# Patient Record
Sex: Female | Born: 1965 | Hispanic: No | Marital: Married | Smoking: Current every day smoker
Health system: Southern US, Community
[De-identification: ages and names within clinical notes are randomized; demographics above are authoritative.]

## PROBLEM LIST (undated history)

## (undated) DIAGNOSIS — E785 Hyperlipidemia, unspecified: Secondary | ICD-10-CM

## (undated) DIAGNOSIS — D869 Sarcoidosis, unspecified: Secondary | ICD-10-CM

## (undated) DIAGNOSIS — E669 Obesity, unspecified: Secondary | ICD-10-CM

## (undated) DIAGNOSIS — R591 Generalized enlarged lymph nodes: Secondary | ICD-10-CM

## (undated) DIAGNOSIS — F419 Anxiety disorder, unspecified: Secondary | ICD-10-CM

## (undated) HISTORY — DX: Sarcoidosis, unspecified: D86.9

## (undated) HISTORY — DX: Obesity, unspecified: E66.9

## (undated) HISTORY — DX: Generalized enlarged lymph nodes: R59.1

## (undated) HISTORY — PX: BLADDER SURGERY: SHX569

## (undated) HISTORY — DX: Hyperlipidemia, unspecified: E78.5

## (undated) HISTORY — DX: Anxiety disorder, unspecified: F41.9

---

## 1997-06-16 ENCOUNTER — Inpatient Hospital Stay (HOSPITAL_COMMUNITY): Admission: AD | Admit: 1997-06-16 | Discharge: 1997-06-18 | Payer: Self-pay | Admitting: Obstetrics and Gynecology

## 2000-01-25 ENCOUNTER — Ambulatory Visit (HOSPITAL_COMMUNITY): Admission: RE | Admit: 2000-01-25 | Discharge: 2000-01-25 | Payer: Self-pay | Admitting: Internal Medicine

## 2000-01-25 ENCOUNTER — Encounter: Payer: Self-pay | Admitting: Internal Medicine

## 2002-03-22 ENCOUNTER — Other Ambulatory Visit: Admission: RE | Admit: 2002-03-22 | Discharge: 2002-03-22 | Payer: Self-pay | Admitting: Obstetrics and Gynecology

## 2002-03-31 ENCOUNTER — Encounter: Payer: Self-pay | Admitting: Obstetrics and Gynecology

## 2002-03-31 ENCOUNTER — Ambulatory Visit (HOSPITAL_COMMUNITY): Admission: RE | Admit: 2002-03-31 | Discharge: 2002-03-31 | Payer: Self-pay | Admitting: Obstetrics and Gynecology

## 2002-12-17 ENCOUNTER — Ambulatory Visit (HOSPITAL_COMMUNITY): Admission: RE | Admit: 2002-12-17 | Discharge: 2002-12-17 | Payer: Self-pay | Admitting: Internal Medicine

## 2003-04-04 ENCOUNTER — Ambulatory Visit (HOSPITAL_COMMUNITY): Admission: RE | Admit: 2003-04-04 | Discharge: 2003-04-04 | Payer: Self-pay | Admitting: Obstetrics and Gynecology

## 2003-04-07 ENCOUNTER — Other Ambulatory Visit: Admission: RE | Admit: 2003-04-07 | Discharge: 2003-04-07 | Payer: Self-pay | Admitting: Obstetrics and Gynecology

## 2003-05-06 ENCOUNTER — Encounter: Payer: Self-pay | Admitting: Emergency Medicine

## 2004-04-09 ENCOUNTER — Other Ambulatory Visit: Admission: RE | Admit: 2004-04-09 | Discharge: 2004-04-09 | Payer: Self-pay | Admitting: Obstetrics and Gynecology

## 2004-04-12 ENCOUNTER — Ambulatory Visit (HOSPITAL_COMMUNITY): Admission: RE | Admit: 2004-04-12 | Discharge: 2004-04-12 | Payer: Self-pay | Admitting: Obstetrics and Gynecology

## 2004-06-28 ENCOUNTER — Observation Stay (HOSPITAL_COMMUNITY): Admission: RE | Admit: 2004-06-28 | Discharge: 2004-06-29 | Payer: Self-pay | Admitting: Obstetrics and Gynecology

## 2004-11-21 ENCOUNTER — Ambulatory Visit: Payer: Self-pay | Admitting: Internal Medicine

## 2005-05-09 ENCOUNTER — Ambulatory Visit (HOSPITAL_COMMUNITY): Admission: RE | Admit: 2005-05-09 | Discharge: 2005-05-09 | Payer: Self-pay | Admitting: Obstetrics and Gynecology

## 2005-05-22 ENCOUNTER — Other Ambulatory Visit: Admission: RE | Admit: 2005-05-22 | Discharge: 2005-05-22 | Payer: Self-pay | Admitting: Obstetrics and Gynecology

## 2005-07-04 ENCOUNTER — Ambulatory Visit: Payer: Self-pay | Admitting: Internal Medicine

## 2005-09-17 ENCOUNTER — Encounter: Admission: RE | Admit: 2005-09-17 | Discharge: 2005-09-17 | Payer: Self-pay | Admitting: Obstetrics and Gynecology

## 2006-09-16 ENCOUNTER — Ambulatory Visit: Payer: Self-pay | Admitting: Internal Medicine

## 2006-09-16 LAB — CONVERTED CEMR LAB
ALT: 15 units/L (ref 0–35)
AST: 17 units/L (ref 0–37)
BUN: 15 mg/dL (ref 6–23)
Basophils Absolute: 0 10*3/uL (ref 0.0–0.1)
Basophils Relative: 0.1 % (ref 0.0–1.0)
Calcium: 8.9 mg/dL (ref 8.4–10.5)
Chloride: 106 meq/L (ref 96–112)
Eosinophils Relative: 2.8 % (ref 0.0–5.0)
GFR calc Af Amer: 117 mL/min
HCT: 38.1 % (ref 36.0–46.0)
HDL: 30.7 mg/dL — ABNORMAL LOW (ref >39.0)
Hemoglobin: 13.2 g/dL (ref 12.0–15.0)
LDL Cholesterol: 81 mg/dL (ref 0–99)
Lymphocytes Relative: 35.3 % (ref 12.0–46.0)
Platelets: 173 10*3/uL (ref 150–400)
Potassium: 4.1 meq/L (ref 3.5–5.1)
RBC: 3.99 M/uL (ref 3.87–5.11)
RDW: 12 % (ref 11.5–14.6)
Sodium: 141 meq/L (ref 135–145)
Total Bilirubin: 0.6 mg/dL (ref 0.3–1.2)
Total CHOL/HDL Ratio: 4.2
Total Protein: 6.7 g/dL (ref 6.0–8.3)
WBC: 6.8 10*3/uL (ref 4.5–10.5)

## 2006-09-17 ENCOUNTER — Ambulatory Visit: Payer: Self-pay | Admitting: Internal Medicine

## 2006-09-17 ENCOUNTER — Encounter: Payer: Self-pay | Admitting: Internal Medicine

## 2006-09-17 DIAGNOSIS — F411 Generalized anxiety disorder: Secondary | ICD-10-CM | POA: Insufficient documentation

## 2006-09-17 DIAGNOSIS — D869 Sarcoidosis, unspecified: Secondary | ICD-10-CM | POA: Insufficient documentation

## 2006-09-17 DIAGNOSIS — E785 Hyperlipidemia, unspecified: Secondary | ICD-10-CM | POA: Insufficient documentation

## 2006-09-17 DIAGNOSIS — R599 Enlarged lymph nodes, unspecified: Secondary | ICD-10-CM | POA: Insufficient documentation

## 2006-09-17 DIAGNOSIS — J309 Allergic rhinitis, unspecified: Secondary | ICD-10-CM | POA: Insufficient documentation

## 2006-09-17 DIAGNOSIS — E669 Obesity, unspecified: Secondary | ICD-10-CM | POA: Insufficient documentation

## 2006-09-19 ENCOUNTER — Ambulatory Visit (HOSPITAL_COMMUNITY): Admission: RE | Admit: 2006-09-19 | Discharge: 2006-09-19 | Payer: Self-pay | Admitting: Obstetrics and Gynecology

## 2006-10-10 ENCOUNTER — Inpatient Hospital Stay (HOSPITAL_BASED_OUTPATIENT_CLINIC_OR_DEPARTMENT_OTHER): Admission: RE | Admit: 2006-10-10 | Discharge: 2006-10-10 | Payer: Self-pay | Admitting: Cardiology

## 2006-10-31 ENCOUNTER — Encounter: Payer: Self-pay | Admitting: Emergency Medicine

## 2006-11-25 ENCOUNTER — Encounter: Payer: Self-pay | Admitting: Emergency Medicine

## 2006-12-01 ENCOUNTER — Encounter: Payer: Self-pay | Admitting: Internal Medicine

## 2008-02-10 ENCOUNTER — Ambulatory Visit (HOSPITAL_COMMUNITY): Admission: RE | Admit: 2008-02-10 | Discharge: 2008-02-10 | Payer: Self-pay | Admitting: Obstetrics and Gynecology

## 2009-04-03 ENCOUNTER — Ambulatory Visit (HOSPITAL_COMMUNITY): Admission: RE | Admit: 2009-04-03 | Discharge: 2009-04-03 | Payer: Self-pay | Admitting: Obstetrics and Gynecology

## 2009-09-22 ENCOUNTER — Ambulatory Visit: Payer: Self-pay | Admitting: Emergency Medicine

## 2009-09-22 DIAGNOSIS — F17200 Nicotine dependence, unspecified, uncomplicated: Secondary | ICD-10-CM | POA: Insufficient documentation

## 2009-10-19 ENCOUNTER — Telehealth (INDEPENDENT_AMBULATORY_CARE_PROVIDER_SITE_OTHER): Payer: Self-pay | Admitting: *Deleted

## 2009-10-25 ENCOUNTER — Telehealth (INDEPENDENT_AMBULATORY_CARE_PROVIDER_SITE_OTHER): Payer: Self-pay | Admitting: *Deleted

## 2009-10-30 ENCOUNTER — Ambulatory Visit: Payer: Self-pay | Admitting: Emergency Medicine

## 2009-11-06 ENCOUNTER — Ambulatory Visit: Payer: Self-pay | Admitting: Emergency Medicine

## 2010-02-04 ENCOUNTER — Encounter: Payer: Self-pay | Admitting: Obstetrics and Gynecology

## 2010-02-15 NOTE — Letter (Signed)
Summary: [facility]   Imported By: Sherian Rein 11/30/2009 07:59:40  _____________________________________________________________________  External Attachment:    Type:   Image     Comment:   External Document

## 2010-02-15 NOTE — Letter (Signed)
Summary: Pulmonary/[facility]  Pulmonary/[facility]   Imported By: Sherian Rein 11/08/2009 14:28:37  _____________________________________________________________________  External Attachment:    Type:   Image     Comment:   External Document

## 2010-02-15 NOTE — Progress Notes (Signed)
Summary: talk to nurse  Phone Note Call from Patient Call back at [phone]   Caller: Patient Call For: byrum Summary of Call: need to talk to nurse about her condition Initial call taken by: Rickard Aimee Holmes,  October 19, 2009 2:25 PM  Follow-up for Phone Call        Called and spoke with pt.  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  She is afraid that being exposed to rat feces will cause her sarcoid to flare.  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  She wants to know what RB thinks.  He is out of the office until 10/10 and wants to hear back sooner so will forward to doc of the day.  Pls advise thanks! Follow-up by: Vernie Murders,  October 19, 2009 3:05 PM  Additional Follow-up for Phone Call Additional follow up Details #1::        I don't know any reason why this would affect her sarcoid. Ok for her to keep working there until she can discuss with Dr Delton Coombes. Additional Follow-up by: Waymon Budge MD,  October 19, 2009 4:52 PM    Additional Follow-up for Phone Call Additional follow up Details #2::    Spoke with pt and informed of the above per Dr Maple Hudson.  Pt verbalized understanding.  I advised that I will still forward the msg to RB ion case has any further recs.  If no further recs, just sign the note, thanks! Follow-up by: Vernie Murders,  October 19, 2009 4:57 PM

## 2010-02-15 NOTE — Progress Notes (Signed)
  Phone Note Other Incoming   Request: Send information Summary of Call: Records received from Dr. [facility]. 3 pages forwarded to Dr. Delton Coombes for review.

## 2010-02-15 NOTE — Letter (Signed)
Summary: [facility]   Imported By: Lester Deerfield 10/06/2009 09:16:46  _____________________________________________________________________  External Attachment:    Type:   Image     Comment:   External Document

## 2010-02-15 NOTE — Assessment & Plan Note (Signed)
Summary: Sarcoidosis   Visit Type:  Initial Consult Copy to:  self Primary Provider/Referring Provider:  Dr. Gwenette Greet  CC:  Pt here to have Sarcoidosis followed up. Pt c/o one episode of chest heaviness x 1 month ago.  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 Has been followed by Dr Tennis Must at [facility]. To her knowledge no biopsy done, she has had PFT, CTs and an elevated ACE level. She has had rash, treats with topical hydrocort cream. Has been treated with prednisone x 5 months 2005-6, never since. Breathing has never really been limited. Never needed inhaled medications. Followed by Dr Anne Fu for R atrial enlargement, recent nuclear stress test was normal per her report.   Preventive Screening-Counseling & Management  Alcohol-Tobacco     Alcohol drinks/day: <1     Alcohol type: beer     Smoking Status: current     Smoking Cessation Counseling: yes     Smoke Cessation Stage: contemplative     Packs/Day: 0.5     Year Started: 1995     Pack years: 10  Current Medications (verified): 1)  None  Allergies (verified): No Known Drug Allergies  Past History:  Past Medical History: H/O Lymphadenopathy Obesity Sarcoidosis, no biopsy  Allergic rhinitis Anxiety Hyperlipidemia  Past Surgical History: Bladder tacked  Family History: Reviewed history and no changes required. Family History Breast Cancer-paternal aunts Throat cancer (non-smoker)-paternal grandfather Cancer-paternal grandmother  Social History: Reviewed history and no changes required. Marital Status: Married, lives with spouse Kaylor Maiers Children: Yes Occupation: Homemaker Patient is a current smoker.  Packs/Day:  0.5 Alcohol drinks/day:  <1 Pack years:  10  Review of Systems       The patient complains of chest pain, anxiety, hand/feet swelling, and joint stiffness or pain.  The patient denies  shortness of breath with activity, shortness of breath at rest, productive cough, non-productive cough, coughing up blood, irregular heartbeats, acid heartburn, indigestion, loss of appetite, weight change, abdominal pain, difficulty swallowing, sore throat, tooth/dental problems, headaches, nasal congestion/difficulty breathing through nose, sneezing, itching, ear ache, depression, rash, change in color of mucus, and fever.    Vital Signs:  Patient profile:   45 year old female Height:      63.5 inches Weight:      170.50 pounds BMI:     29.84 O2 Sat:      99 % on Room air Temp:     97.5 degrees F oral Pulse rate:   85 / minute BP sitting:   106 / 62  (right arm) Cuff size:   regular  Vitals Entered By: Zackery Barefoot CMA (September 22, 2009 9:29 AM)  O2 Flow:  Room air CC: Pt here to have Sarcoidosis followed up. Pt c/o one episode of chest heaviness x 1 month ago Comments Medications reviewed with patient Verified contact number and pharmacy with patient Zackery Barefoot CMA  September 22, 2009 9:29 AM    Physical Exam  General:  normal appearance and healthy appearing.   Head:  normocephalic and atraumatic Eyes:  conjunctiva and sclera clear Nose:  no deformity, discharge, inflammation, or lesions Mouth:  no deformity or lesions Neck:  no masses, thyromegaly, or abnormal cervical nodes Lungs:  clear bilaterally to auscultation on normal breath, mild exp wheeze on forced expiration Heart:  regular rate and rhythm, S1, S2 without murmurs, rubs, gallops, or clicks Abdomen:  not examined  Msk:  no deformity or scoliosis noted with normal posture Extremities:  no clubbing, cyanosis, edema, or deformity noted Neurologic:  non-focal Skin:  no rashes Psych:  alert and cooperative; normal mood and affect; normal attention span and concentration   Impression & Recommendations:  Problem # 1:  SARCOIDOSIS, 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 No bx done to  date. Will obtain records from [facility] to review the w/u. No pulm limitation currently and CXR essentially normal.  - CXR today to compare - full PFT's - records from [facility]   Problem # 2:  TOBACCO ABUSE (ICD-305.1) Discussed cesssation, she wants to stop, not ready to set quit date yet  Other Orders: Consultation Level IV (46962) T-2 View CXR (71020TC)  Patient Instructions: 1)  We will obtain your records from [facility] and from [facility].  2)  We will perform full Pulmonary Function Tests at your next visit to compare with your priors. 3)  CXR today.  4)  Follow up with Dr Delton Coombes next available to review your PFTs

## 2010-02-15 NOTE — Miscellaneous (Signed)
Summary: Orders Update pft charges  Clinical Lists Changes  Orders: Added new Service order of Carbon Monoxide diffusing w/capacity (94720) - Signed Added new Service order of Lung Volumes (94240) - Signed Added new Service order of Spirometry (Pre & Post) (94060) - Signed 

## 2010-02-15 NOTE — Assessment & Plan Note (Signed)
Summary: sarcoidosis   Visit Type:  Follow-up Copy to:  self Primary Analiza Cowger/Referring Kiarra Kidd:  Dr. Gwenette Greet  CC:  Sarcoid...discuss PFT results today.  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 Has been followed by Dr Tennis Must at [facility]. To her knowledge no biopsy done, she has had PFT, CTs and an elevated ACE level. She has had rash, treats with topical hydrocort cream. Has been treated with prednisone x 5 months 2005-6, never since. Breathing has never really been limited. Never needed inhaled medications. Followed by Dr Anne Fu for R atrial enlargement, recent nuclear stress test was normal per her report.   ROV 11/06/09 -- returns for f/u after PFTs. Has been followed for presumed sarcoidosis at [facility], now established here. Has been doing well, no breatning limitations. Eye exam up to date. No palpitations or rhythm symptoms.   Current Medications (verified): 1)  None  Allergies (verified): No Known Drug Allergies  Vital Signs:  Patient profile:   45 year old female Height:      64 inches (162.56 cm) Weight:      166 pounds (75.45 kg) BMI:     28.60 O2 Sat:      99 % on Room air Temp:     98.3 degrees F (36.83 degrees C) oral Pulse rate:   61 / minute BP sitting:   104 / 70  (right arm) Cuff size:   regular  Vitals Entered By: Michel Bickers CMA (November 06, 2009 1:56 PM)  O2 Sat at Rest %:  99 O2 Flow:  Room air CC: Sarcoid...discuss PFT results today Comments Medications reviewed with patient Daytime phone verified.Michel Bickers [facility]  November 06, 2009 2:03 PM   Physical Exam  General:  normal appearance and healthy appearing.   Head:  normocephalic and atraumatic Eyes:  conjunctiva and sclera clear Nose:  no deformity, discharge, inflammation, or lesions Mouth:  no deformity or lesions Neck:  no masses, thyromegaly, or abnormal cervical nodes Lungs:  clear bilaterally  to auscultation on normal breath Heart:  regular rate and rhythm, S1, S2 without murmurs, rubs, gallops, or clicks Abdomen:  not examined Msk:  no deformity or scoliosis noted with normal posture Extremities:  no clubbing, cyanosis, edema, or deformity noted Neurologic:  non-focal Skin:  no rashes Psych:  alert and cooperative; normal mood and affect; normal attention span and concentration   Pulmonary Function Test Date: 10/30/2009 Height (in.): 64 Gender: Female  Pre-Spirometry FVC    Value: 9.97 L/min   Pred: 3.40 L/min     % Pred: 99 % FEV1    Value: 2.75 L     Pred: 2.61 L     % Pred: 105 % FEV1/FVC  Value: 81 %     Pred: 76 %    FEF 25-75  Value: 2.83 L/min   Pred: 3.01 L/min     % Pred: 94 %  Post-Spirometry FVC    Value: 3.29 L/min   Pred: 3.40 L/min     % Pred: 97 % FEV1    Value: 2.58 L     Pred: 2.61 L     % Pred: 99 % FEV1/FVC  Value: 78 %     Pred: 76 %    FEF 25-75  Value: 2.48 L/min   Pred: 3.01 L/min     % Pred: 83 %  Lung Volumes TLC    Value: 5.18 L   %  Pred: 103 % RV    Value: 1.64 L   % Pred: 96 % DLCO    Value: 17.3 %   % Pred: 74 % DLCO/VA  Value: 4.23 %   % Pred: 104 %  Comments: Normal airflows, normal response to BD. Normal volumes. Decreased difusion that corrects to normal for Va. 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  -f/u in 1 yr with CXR and ACE level.  - may at some point need to reconsider biopsy. If she gets LAD or rash would probably be prudent to confirm the dx.   Other Orders: Est. Patient Level IV (16109)  Patient Instructions: 1)  Your PFTs and your CXR are normal.  2)  We will follow up with a CXR in 1 year.  3)  Call Dr Delton Coombes to be evaluated if you notice any new breathing symptoms, rash, visual changes or paliptations.  4)  Get your eye exam every year.

## 2010-05-29 NOTE — Cardiovascular Report (Signed)
Aimee Holmes, Aimee Holmes             ACCOUNT NO.:  [id number]   MEDICAL RECORD NO.:  [id number]          PATIENT TYPE:  OIB   LOCATION:  1965                         FACILITY:  [facility]   PHYSICIAN:  Jake Bathe, MD      DATE OF BIRTH:  [birth year]   DATE OF PROCEDURE:  DATE OF DISCHARGE:  10/10/2006                            CARDIAC CATHETERIZATION   PROCEDURE:  Right heart catheterization with thick and thermal cardiac  outputs measured.   INDICATIONS:  Aimee Holmes is a 45 year old African American female with  diagnosis of sarcoidosis with recent echocardiogram demonstrating right  ventricular and right atrial enlargement and mildly elevated pulmonary  artery estimated pressures.   PROCEDURE:  The patient was consented.  Risks and benefits explained.  She was placed on the catheterization table, prepped in a sterile  fashion.  Using 1% lidocaine to infiltrate the groin for local  anesthesia, she was also given 0.5 mg of Versed as well as 12.5 mcg of  fentanyl for conscious sedation.  Using the modified Seldinger  technique, the right femoral vein was cannulated and a 7-French sheath  was placed in the artery under fluoroscopic guidance.  Then using a 7-  French balloon wedge catheter, under fluoroscopic guidance, this was  placed in all right side chambers of the heart and placed in the wedge  position.  Hemodynamics were obtained.  Pulmonary artery saturation was  obtained.  Femoral artery was not obtained, however, estimation for  arterial saturation was 98% on pulse oximetry.  Thermodilution cardiac  output was then run.  The chambers were then assessed hemodynamically  and the catheter was then removed.  The sheath was removed.  The patient  tolerated the procedure well.  Manual compression held, no  complications.   Hemodynamics as follows:  Right atrial pressure 9, right ventricular  pressure 29/4 with an EDP of 10, pulmonary artery pressures 29/12 with a  mean of 18 and  pulmonary capillary wedge pressure of 13 mmHg.   Pulmonary artery saturation was 73%.  Estimated femoral artery  saturation was 98%.  Fick cardiac output was 3.9 l/m with a cardiac  index of 2.2.  Thermodilution cardiac output was 5.3 liters per minute  with a cardiac index of 3.0.   IMPRESSION:  1. Normal right sided pressures.  No evidence of pulmonary      hypertension.  See above for details.  2. Normal cardiac output as measured above.   RECOMMENDATIONS:  We plan MRI at [facility] and will interpret results of 24  hour Holter monitor.  Recommend repeat echocardiogram yearly or if  symptoms develop.  Will also be seeing Dr. Tennis Must of [facility] pulmonary  for consultation.     Jake Bathe, MD  Electronically Signed    MCS/MEDQ  D:  10/10/2006  T:  10/10/2006  Job:  [id number]   cc:   Corwin Levins, MD

## 2010-09-14 ENCOUNTER — Other Ambulatory Visit (HOSPITAL_COMMUNITY): Payer: Self-pay | Admitting: Obstetrics and Gynecology

## 2010-09-14 DIAGNOSIS — Z1231 Encounter for screening mammogram for malignant neoplasm of breast: Secondary | ICD-10-CM

## 2010-09-24 ENCOUNTER — Ambulatory Visit (HOSPITAL_COMMUNITY)
Admission: RE | Admit: 2010-09-24 | Discharge: 2010-09-24 | Disposition: A | Discharge status: Home / Self Care | Payer: Managed Care, Other (non HMO) | Source: Ambulatory Visit | Attending: Obstetrics and Gynecology | Admitting: Obstetrics and Gynecology

## 2010-09-24 DIAGNOSIS — Z1231 Encounter for screening mammogram for malignant neoplasm of breast: Secondary | ICD-10-CM | POA: Insufficient documentation

## 2010-10-25 LAB — POCT I-STAT 3, VENOUS BLOOD GAS (G3P V)
Bicarbonate: 24.4 — ABNORMAL HIGH
TCO2: 26
pO2, Ven: 40

## 2010-11-22 ENCOUNTER — Ambulatory Visit: Payer: Managed Care, Other (non HMO) | Admitting: Emergency Medicine

## 2010-12-14 ENCOUNTER — Encounter: Payer: Self-pay | Admitting: Emergency Medicine

## 2010-12-17 ENCOUNTER — Encounter: Payer: Self-pay | Admitting: Emergency Medicine

## 2010-12-17 ENCOUNTER — Ambulatory Visit (INDEPENDENT_AMBULATORY_CARE_PROVIDER_SITE_OTHER): Payer: Managed Care, Other (non HMO) | Admitting: Emergency Medicine

## 2010-12-17 ENCOUNTER — Ambulatory Visit (INDEPENDENT_AMBULATORY_CARE_PROVIDER_SITE_OTHER)
Admission: RE | Admit: 2010-12-17 | Discharge: 2010-12-17 | Disposition: A | Discharge status: Home / Self Care | Payer: Managed Care, Other (non HMO) | Source: Ambulatory Visit | Attending: Emergency Medicine | Admitting: Emergency Medicine

## 2010-12-17 VITALS — BP 102/70 | HR 76 | Temp 98.6°F | Ht 64.0 in | Wt 169.0 lb

## 2010-12-17 DIAGNOSIS — D869 Sarcoidosis, unspecified: Secondary | ICD-10-CM

## 2010-12-17 NOTE — Assessment & Plan Note (Signed)
Clinically stable, no evidence active dz - cxr today - f/u ~ 1 year or prn - optho exam every yr - cards f/u every yr

## 2010-12-17 NOTE — Patient Instructions (Signed)
CXR today Please call our office if you notice rash, enlarging nodes or breathing difficulty Follow with Dr Delton Coombes in January 2014

## 2010-12-17 NOTE — Progress Notes (Signed)
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 Has been followed by Dr Tennis Must at [facility]. To her knowledge no biopsy done, she has had PFT, CTs and an elevated ACE level. She has had rash, treats with topical hydrocort cream. Has been treated with prednisone x 5 months 2005-6, never since. Breathing has never really been limited. Never needed inhaled medications. Followed by Dr Anne Fu for R atrial enlargement, recent nuclear stress test was normal per her report.   ROV 11/06/09 -- returns for f/u after PFTs. Has been followed for presumed sarcoidosis at [facility], now established here. Has been doing well, no breatning limitations. Eye exam up to date. No palpitations or rhythm symptoms.   ROV 12/17/10 -- 45 yo woman, smoker, hx presumed sarcoidosis. PFT and CXR in 2011 were essentially normal. Not having any current problems. She did have some R wrist pain and treated w steroids. No rash. Saw Dr Anne Fu, got good report regarding rhythm, etc. PCP is Dorothyann Peng.    Pulmonary Function Test  Date: 10/30/2009  Height (in.): 64  Gender: Female  Pre-Spirometry  FVC Value: 9.97 L/min Pred: 3.40 L/min % Pred: 99 %  FEV1 Value: 2.75 L Pred: 2.61 L % Pred: 105 %  FEV1/FVC Value: 81 % Pred: 76 % FEF 25-75 Value: 2.83 L/min Pred: 3.01 L/min % Pred: 94 %  Post-Spirometry  FVC Value: 3.29 L/min Pred: 3.40 L/min % Pred: 97 %  FEV1 Value: 2.58 L Pred: 2.61 L % Pred: 99 %  FEV1/FVC Value: 78 % Pred: 76 % FEF 25-75 Value: 2.48 L/min Pred: 3.01 L/min % Pred: 83 %  Lung Volumes  TLC Value: 5.18 L % Pred: 103 %  RV Value: 1.64 L % Pred: 96 %  DLCO Value: 17.3 % % Pred: 74 %  DLCO/VA Value: 4.23 % % Pred: 104 %  Comments:  Normal airflows, normal response to BD. Normal volumes. Decreased difusion that corrects to normal for Va. RSB  Gen: Pleasant, well-nourished, in no distress,  normal affect  ENT: No lesions,  mouth clear,  oropharynx clear, no  postnasal drip  Neck: No JVD, no TMG, no carotid bruits  Lungs: No use of accessory muscles, no dullness to percussion, clear without rales or rhonchi  Cardiovascular: RRR, heart sounds normal, no murmur or gallops, no peripheral edema  Musculoskeletal: No deformities, no cyanosis or clubbing  Neuro: alert, non focal  Skin: Warm, no lesions or rashes   SARCOIDOSIS, PULMONARY Clinically stable, no evidence active dz - cxr today - f/u ~ 1 year or prn - optho exam every yr - cards f/u every yr

## 2010-12-31 ENCOUNTER — Telehealth: Payer: Self-pay | Admitting: Emergency Medicine

## 2010-12-31 NOTE — Telephone Encounter (Signed)
Please inform pt that his CXR is stable. Thanks--[email or web address]

## 2011-01-01 NOTE — Telephone Encounter (Signed)
Pt aware of cxr results  

## 2011-07-22 ENCOUNTER — Telehealth: Payer: Self-pay | Admitting: Obstetrics and Gynecology

## 2011-07-23 ENCOUNTER — Telehealth: Payer: Self-pay

## 2011-07-23 NOTE — Telephone Encounter (Signed)
LM for pt to cb. Pt is ready to proceed with surgery. Pt will need preop appt and visit w/ AR on the same day, per AR. Melody Comas A

## 2011-07-25 ENCOUNTER — Telehealth: Payer: Self-pay

## 2011-07-25 NOTE — Telephone Encounter (Signed)
Pt called stating that she is ready to sched AP repair and TVT. Per AR's previous chart notes, when pt calls to get this scheduled, she should see EP for pre-op the same day and then Dr. Su Hilt right afterward. Appts booked for 08/07/11 @ 9am w/ EP and 9:45 with AR. Melody Comas A

## 2011-07-31 ENCOUNTER — Telehealth: Payer: Self-pay | Admitting: Obstetrics and Gynecology

## 2011-08-07 ENCOUNTER — Encounter: Payer: Self-pay | Admitting: Obstetrics and Gynecology

## 2011-09-26 ENCOUNTER — Telehealth: Payer: Self-pay

## 2011-09-26 NOTE — Telephone Encounter (Signed)
Faxed over 1 RF on the Hydrocortisone/ Val 0.2% cream  60 gm tube with 0 further RF's  Sig: apply to affected area daily. Per AR. Melody Comas A

## 2012-01-01 ENCOUNTER — Other Ambulatory Visit: Payer: Self-pay | Admitting: Obstetrics and Gynecology

## 2012-01-01 DIAGNOSIS — Z1231 Encounter for screening mammogram for malignant neoplasm of breast: Secondary | ICD-10-CM

## 2012-03-17 ENCOUNTER — Ambulatory Visit (HOSPITAL_COMMUNITY)
Admission: RE | Admit: 2012-03-17 | Discharge: 2012-03-17 | Disposition: A | Discharge status: Home / Self Care | Payer: Managed Care, Other (non HMO) | Source: Ambulatory Visit | Attending: Obstetrics and Gynecology | Admitting: Obstetrics and Gynecology

## 2012-03-17 DIAGNOSIS — Z1231 Encounter for screening mammogram for malignant neoplasm of breast: Secondary | ICD-10-CM

## 2012-03-30 ENCOUNTER — Ambulatory Visit: Payer: Self-pay | Admitting: Obstetrics and Gynecology

## 2012-11-03 ENCOUNTER — Other Ambulatory Visit: Payer: Self-pay | Admitting: Obstetrics and Gynecology

## 2012-11-03 DIAGNOSIS — N611 Abscess of the breast and nipple: Secondary | ICD-10-CM

## 2012-11-03 DIAGNOSIS — N61 Mastitis without abscess: Secondary | ICD-10-CM

## 2012-11-05 ENCOUNTER — Other Ambulatory Visit: Payer: Self-pay | Admitting: Obstetrics and Gynecology

## 2012-11-05 DIAGNOSIS — N611 Abscess of the breast and nipple: Secondary | ICD-10-CM

## 2012-11-16 ENCOUNTER — Other Ambulatory Visit: Payer: Managed Care, Other (non HMO)

## 2012-11-17 ENCOUNTER — Ambulatory Visit
Admission: RE | Admit: 2012-11-17 | Discharge: 2012-11-17 | Disposition: A | Discharge status: Home / Self Care | Payer: Managed Care, Other (non HMO) | Source: Ambulatory Visit | Attending: Obstetrics and Gynecology | Admitting: Obstetrics and Gynecology

## 2012-11-17 DIAGNOSIS — N611 Abscess of the breast and nipple: Secondary | ICD-10-CM

## 2013-04-22 ENCOUNTER — Other Ambulatory Visit: Payer: Self-pay | Admitting: Obstetrics and Gynecology

## 2013-04-22 DIAGNOSIS — Z1231 Encounter for screening mammogram for malignant neoplasm of breast: Secondary | ICD-10-CM

## 2013-05-05 ENCOUNTER — Ambulatory Visit (HOSPITAL_COMMUNITY)
Admission: RE | Admit: 2013-05-05 | Discharge: 2013-05-05 | Disposition: A | Discharge status: Home / Self Care | Payer: BC Managed Care – PPO | Source: Ambulatory Visit | Attending: Obstetrics and Gynecology | Admitting: Obstetrics and Gynecology

## 2013-05-05 DIAGNOSIS — Z1231 Encounter for screening mammogram for malignant neoplasm of breast: Secondary | ICD-10-CM | POA: Insufficient documentation

## 2014-01-18 ENCOUNTER — Encounter: Payer: Self-pay | Admitting: Cardiology

## 2014-03-11 ENCOUNTER — Ambulatory Visit: Payer: Managed Care, Other (non HMO) | Admitting: Cardiology

## 2014-03-18 ENCOUNTER — Ambulatory Visit (INDEPENDENT_AMBULATORY_CARE_PROVIDER_SITE_OTHER): Payer: BLUE CROSS/BLUE SHIELD | Admitting: Cardiology

## 2014-03-18 ENCOUNTER — Encounter: Payer: Self-pay | Admitting: Cardiology

## 2014-03-18 VITALS — BP 116/74 | HR 86 | Ht 64.0 in | Wt 173.0 lb

## 2014-03-18 DIAGNOSIS — Z720 Tobacco use: Secondary | ICD-10-CM

## 2014-03-18 DIAGNOSIS — F17200 Nicotine dependence, unspecified, uncomplicated: Secondary | ICD-10-CM

## 2014-03-18 DIAGNOSIS — D869 Sarcoidosis, unspecified: Secondary | ICD-10-CM

## 2014-03-18 NOTE — Progress Notes (Signed)
      [address] [place], [place]  [id number] Phone: [phone] Fax:  [phone]  Date:  03/18/2014   ID:  Aimee Holmes, DOB 02/14/1965, MRN [id number]  PCP:  Lupita RaiderSHAW,KIMBERLEE, MD   History of Present Illness: Aimee Holmes is a 49 y.o. 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 Holter monitor showed rare PACs, no arrhythmias. She also had a right heart catheterization demonstrating normal right-sided pressures. 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  Overall she is doing very well. No further sarcoidosis flares.   Wt Readings from Last 3 Encounters:  03/18/14 173 lb (78.472 kg)  12/17/10 169 lb (76.658 kg)  11/06/09 166 lb (75.297 kg)     Past Medical History  Diagnosis Date  . Lymphadenopathy   . Obesity   . Sarcoidosis   . Allergic rhinitis   . Anxiety   . Hyperlipidemia     Past Surgical History  Procedure Laterality Date  . Bladder surgery      tack    No current outpatient prescriptions on file.   No current facility-administered medications for this visit.    Allergies:   No Known Allergies  Social History:  The patient  reports that she has been smoking.  She does not have any smokeless tobacco history on file.   Family History  Problem Relation Age of Onset  . Breast cancer Paternal Aunt   . Throat cancer Paternal Grandfather     non smoker  . Cancer Paternal Grandmother     ROS:  Please see the history of present illness.   Denies any fevers, chills, orthopnea, PND, syncope   All other systems reviewed and negative.   PHYSICAL EXAM: VS:  BP 116/74 mmHg  Pulse 86  Ht 5\' 4"  (1.626 m)  Wt 173 lb (78.472 kg)  BMI 29.68 kg/m2 Well nourished, well developed, in no acute distress HEENT: normal, [place]/AT, EOMI Neck: no JVD, normal carotid upstroke, no bruit Cardiac:  normal S1, S2; RRR; no murmur Lungs:   clear to auscultation bilaterally, no wheezing, rhonchi or rales Abd: soft, nontender, no hepatomegaly, no bruits Ext: no edema, 2+ distal pulses Skin: warm and dry GU: deferred Neuro: no focal abnormalities noted, AAO x 3  EKG:  03/18/14-sinus rhythm, nonspecific ST-T wave changes, heart rate 86     ASSESSMENT AND PLAN:  1. Sarcoidosis-I will go ahead and order echocardiogram since it has been a few years. We will evaluate her right-sided heart pressures. Overall she's doing well. 2. Tobacco - encouraged cessation.  3. We will see her back in 2 years.  Signed, Donato SchultzMark Skains, MD [facility]  03/18/2014 2:41 PM

## 2014-03-18 NOTE — Patient Instructions (Signed)
The current medical regimen is effective;  continue present plan and medications.  Your physician has requested that you have an echocardiogram. Echocardiography is a painless test that uses sound waves to create images of your heart. It provides your doctor with information about the size and shape of your heart and how well your heart's chambers and valves are working. This procedure takes approximately one hour. There are no restrictions for this procedure.  Follow up in 2 years with Dr. Anne FuSkains.  You will receive a letter in the mail 2 months before you are due.  Please call us when you receive this letter to schedule your follow up appointment.  Thank you for choosing [facility]!!

## 2014-03-28 ENCOUNTER — Ambulatory Visit (HOSPITAL_COMMUNITY): Payer: BLUE CROSS/BLUE SHIELD | Attending: Cardiology | Admitting: Radiology

## 2014-03-28 DIAGNOSIS — D869 Sarcoidosis, unspecified: Secondary | ICD-10-CM | POA: Diagnosis not present

## 2014-03-28 NOTE — Progress Notes (Signed)
Echocardiogram performed.  

## 2014-03-31 ENCOUNTER — Telehealth: Payer: Self-pay | Admitting: Cardiology

## 2014-03-31 NOTE — Telephone Encounter (Signed)
New Msg        Pt returning call about echo results.  Does pt have an enlarged right atrium?    Please return call.

## 2014-03-31 NOTE — Telephone Encounter (Signed)
Reviewed results of echo with pt.  She is aware that her right atrium is of normal size according to documentation.  She thanked me for my time and explanation.

## 2014-04-25 ENCOUNTER — Other Ambulatory Visit (HOSPITAL_COMMUNITY): Payer: Self-pay | Admitting: Obstetrics and Gynecology

## 2014-04-25 DIAGNOSIS — Z1231 Encounter for screening mammogram for malignant neoplasm of breast: Secondary | ICD-10-CM

## 2014-05-10 ENCOUNTER — Ambulatory Visit (HOSPITAL_COMMUNITY)
Admission: RE | Admit: 2014-05-10 | Discharge: 2014-05-10 | Disposition: A | Discharge status: Home / Self Care | Payer: BLUE CROSS/BLUE SHIELD | Source: Ambulatory Visit | Attending: Obstetrics and Gynecology | Admitting: Obstetrics and Gynecology

## 2014-05-10 DIAGNOSIS — Z1231 Encounter for screening mammogram for malignant neoplasm of breast: Secondary | ICD-10-CM

## 2014-05-16 ENCOUNTER — Other Ambulatory Visit: Payer: Self-pay | Admitting: Obstetrics and Gynecology

## 2014-05-16 DIAGNOSIS — N63 Unspecified lump in unspecified breast: Secondary | ICD-10-CM

## 2015-06-06 ENCOUNTER — Encounter: Payer: Self-pay | Admitting: Cardiology

## 2015-06-09 ENCOUNTER — Ambulatory Visit (INDEPENDENT_AMBULATORY_CARE_PROVIDER_SITE_OTHER): Payer: BLUE CROSS/BLUE SHIELD | Admitting: Cardiology

## 2015-06-09 VITALS — BP 126/76 | HR 84 | Ht 64.0 in | Wt 178.0 lb

## 2015-06-09 DIAGNOSIS — D869 Sarcoidosis, unspecified: Secondary | ICD-10-CM | POA: Diagnosis not present

## 2015-06-09 DIAGNOSIS — F17200 Nicotine dependence, unspecified, uncomplicated: Secondary | ICD-10-CM

## 2015-06-09 DIAGNOSIS — R079 Chest pain, unspecified: Secondary | ICD-10-CM

## 2015-06-09 NOTE — Patient Instructions (Signed)
Medication Instructions:  The current medical regimen is effective;  continue present plan and medications.  Testing/Procedures: Your physician has requested that you have a stress echocardiogram. For further information please visit [email or web address]. Please follow instruction sheet as given.  Follow-Up: Follow up as needed.  If you need a refill on your cardiac medications before your next appointment, please call your pharmacy.  Thank you for choosing [facility]!!

## 2015-06-09 NOTE — Progress Notes (Signed)
      [address] [place], [place]  [address] Phone: [phone] Fax:  [phone]  Date:  06/09/2015   ID:  Aimee AppleVictoria F Graefe, DOB 07/20/1965, MRN [id number]  PCP:  Lupita RaiderSHAW,KIMBERLEE, MD   History of Present Illness: Aimee Holmes is a 50 y.o. 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 Holter monitor showed rare PACs, no arrhythmias. She also had a right heart catheterization demonstrating normal right-sided pressures. She was a [place] from [place] . Echocardiogram 03/28/14 showed normal right-sided pressures.  Sore in left neck, left chest, left leg, week before felt foggy. Nanny 3 days a week. ?anxiety or panic. Happened 2 times past 3 weeks. Felt heart skipping. Likely PVCs.  Overall she is doing very well. No further sarcoidosis flares.   Wt Readings from Last 3 Encounters:  06/09/15 178 lb (80.74 kg)  03/18/14 173 lb (78.472 kg)  12/17/10 169 lb (76.658 kg)     Past Medical History  Diagnosis Date  . Lymphadenopathy   . Obesity   . Sarcoidosis (HCC)   . Allergic rhinitis   . Anxiety   . Hyperlipidemia     Past Surgical History  Procedure Laterality Date  . Bladder surgery      tack    No current outpatient prescriptions on file.   No current facility-administered medications for this visit.    Allergies:   No Known Allergies  Social History:  The patient  reports that she has been smoking.  She does not have any smokeless tobacco history on file.   Family History  Problem Relation Age of Onset  . Breast cancer Paternal Aunt   . Throat cancer Paternal Grandfather     non smoker  . Cancer Paternal Grandmother     ROS:  Please see the history of present illness.   Denies any fevers, chills, orthopnea, PND, syncope   All other systems reviewed and negative.   PHYSICAL EXAM: VS:  BP 126/76 mmHg  Pulse 84  Ht  5\' 4"  (1.626 m)  Wt 178 lb (80.74 kg)  BMI 30.54 kg/m2 Well nourished, well developed, in no acute distress HEENT: normal, [place]/AT, EOMI Neck: no JVD, normal carotid upstroke, no bruit Cardiac:  normal S1, S2; RRR; no murmur Lungs:  clear to auscultation bilaterally, no wheezing, rhonchi or rales Abd: soft, nontender, no hepatomegaly, no bruits Ext: no edema, 2+ distal pulses Skin: warm and dry GU: deferred Neuro: no focal abnormalities noted, AAO x 3  EKG:   EKG was ordered today-06/09/15 normal sinus rhythm 85, nonspecific ST-T wave changes personally viewed-no change from prior-03/18/14-sinus rhythm, nonspecific ST-T wave changes, heart rate 86    Echocardiogram 03/28/14: Normal EF, no evidence of pulmonary hypertension.  ASSESSMENT AND PLAN:  1. Chest pain, atypical, could've been anxiety related however we will go ahead and check a stress echocardiogram to make sure that there is no evidence of ischemia. Currently she is feeling better. 2. Sarcoidosis- Overall she's doing well. No evidence of pulmonary hypertension. 3. Tobacco - encouraged cessation.  4. We will see her back PRN.  Signed, Donato SchultzMark Skains, MD [facility]  06/13/2015 6:31 AM

## 2015-06-13 ENCOUNTER — Encounter: Payer: Self-pay | Admitting: Cardiology

## 2015-06-15 DIAGNOSIS — R87610 Atypical squamous cells of undetermined significance on cytologic smear of cervix (ASC-US): Secondary | ICD-10-CM | POA: Diagnosis not present

## 2015-06-15 DIAGNOSIS — Z1231 Encounter for screening mammogram for malignant neoplasm of breast: Secondary | ICD-10-CM | POA: Diagnosis not present

## 2015-06-15 DIAGNOSIS — Z124 Encounter for screening for malignant neoplasm of cervix: Secondary | ICD-10-CM | POA: Diagnosis not present

## 2015-06-15 DIAGNOSIS — E559 Vitamin D deficiency, unspecified: Secondary | ICD-10-CM | POA: Diagnosis not present

## 2015-06-15 DIAGNOSIS — Z6831 Body mass index (BMI) 31.0-31.9, adult: Secondary | ICD-10-CM | POA: Diagnosis not present

## 2015-06-15 DIAGNOSIS — Z01419 Encounter for gynecological examination (general) (routine) without abnormal findings: Secondary | ICD-10-CM | POA: Diagnosis not present

## 2015-06-15 DIAGNOSIS — Z139 Encounter for screening, unspecified: Secondary | ICD-10-CM | POA: Diagnosis not present

## 2015-06-26 DIAGNOSIS — R1310 Dysphagia, unspecified: Secondary | ICD-10-CM | POA: Diagnosis not present

## 2015-06-27 DIAGNOSIS — E669 Obesity, unspecified: Secondary | ICD-10-CM | POA: Diagnosis not present

## 2015-06-27 DIAGNOSIS — Z720 Tobacco use: Secondary | ICD-10-CM | POA: Diagnosis not present

## 2015-06-27 DIAGNOSIS — Z0000 Encounter for general adult medical examination without abnormal findings: Secondary | ICD-10-CM | POA: Diagnosis not present

## 2015-06-27 DIAGNOSIS — D860 Sarcoidosis of lung: Secondary | ICD-10-CM | POA: Diagnosis not present

## 2015-06-27 DIAGNOSIS — Z791 Long term (current) use of non-steroidal anti-inflammatories (NSAID): Secondary | ICD-10-CM | POA: Diagnosis not present

## 2015-06-29 DIAGNOSIS — N63 Unspecified lump in breast: Secondary | ICD-10-CM | POA: Diagnosis not present

## 2015-06-30 DIAGNOSIS — N62 Hypertrophy of breast: Secondary | ICD-10-CM | POA: Diagnosis not present

## 2015-07-06 DIAGNOSIS — R87619 Unspecified abnormal cytological findings in specimens from cervix uteri: Secondary | ICD-10-CM | POA: Diagnosis not present

## 2015-07-06 DIAGNOSIS — R87610 Atypical squamous cells of undetermined significance on cytologic smear of cervix (ASC-US): Secondary | ICD-10-CM | POA: Diagnosis not present

## 2015-07-15 DIAGNOSIS — H10021 Other mucopurulent conjunctivitis, right eye: Secondary | ICD-10-CM | POA: Diagnosis not present

## 2015-07-17 ENCOUNTER — Other Ambulatory Visit (HOSPITAL_COMMUNITY): Payer: BLUE CROSS/BLUE SHIELD

## 2015-07-31 DIAGNOSIS — R87619 Unspecified abnormal cytological findings in specimens from cervix uteri: Secondary | ICD-10-CM | POA: Diagnosis not present

## 2015-09-28 DIAGNOSIS — J1189 Influenza due to unidentified influenza virus with other manifestations: Secondary | ICD-10-CM | POA: Diagnosis not present

## 2016-03-20 NOTE — Progress Notes (Deleted)
Cardiology Office Note    Date:  03/20/2016   ID:  Aimee Holmes, DOB 07/23/1965, MRN [id number]  PCP:  Lupita RaiderSHAW,KIMBERLEE, MD  Cardiologist:  Dr. Anne FuSkains  CC: fast heart rate  History of Present Illness:  Aimee Holmes is a 51 y.o. female with a history of pulmonary sarcoidosis, HLD, tobacco abuse, and obesity who presents to clinic for evaluation of fast HRs.   She has a history of pulmonary sarcoidosis, prior nuclear stress test 2011 low risk no ischemia, normal EF and echocardiogram in 2011 showing normal right ventricle. She wore a Holter monitor in 2011 that showed rare PACs, no tha arrhythmias. She also had a right heart catheterization demonstrating normal right-sided pressures. [place] was a [place] Echocardiogram 03/28/14 showed normal right-sided pressures.  She was seen by Dr. Anne FuSkains in 05/2015 for follow up. She complained of atypical chest pain. Stress echo was ordered but never completed. It was felt she could follow up PRN.   Today she presents to clinic for evaluation of palpitations.      Past Medical History:  Diagnosis Date  . Allergic rhinitis   . Anxiety   . Hyperlipidemia   . Lymphadenopathy   . Obesity   . Sarcoidosis [facility](HCC)     Past Surgical History:  Procedure Laterality Date  . BLADDER SURGERY     tack    Current Medications: No outpatient prescriptions prior to visit.   No facility-administered medications prior to visit.      Allergies:   Patient has no known allergies.   Social History   Social History  . Marital status: Married    Spouse name: Loyal GamblerBarry Hegarty  . Number of children: Y  . Years of education: N/A   Occupational History  . home maker    Social History Main Topics  . Smoking status: Current Every Day Smoker  . Smokeless tobacco: Not on file  . Alcohol use Not on file  . Drug use: Unknown  . Sexual activity: Not on file   Other Topics Concern  . Not on file   Social History Narrative    . No narrative on file     Family History:  The patient's ***family history includes Breast cancer in her paternal aunt; Cancer in her paternal grandmother; Throat cancer in her paternal grandfather.      *** ROS/PE    Wt Readings from Last 3 Encounters:  06/09/15 178 lb (80.7 kg)  03/18/14 173 lb (78.5 kg)  12/17/10 169 lb (76.7 kg)      Studies/Labs Reviewed:   EKG:  EKG is*** ordered today.  The ekg ordered today demonstrates ***  Recent Labs: No results found for requested labs within last 8760 hours.   Lipid Panel    Component Value Date/Time   CHOL 129 09/16/2006 0855   TRIG 88 09/16/2006 0855   HDL 30.7 (L) 09/16/2006 0855   CHOLHDL 4.2 CALC 09/16/2006 0855   VLDL 18 09/16/2006 0855   LDLCALC 81 09/16/2006 0855    Additional studies/ records that were reviewed today include:  2D ECHO: 03/28/2014 LV EF: 55% -  60% Study Conclusions - Left ventricle: The cavity size was normal. Wall thickness was normal. Systolic function was normal. The estimated ejection fraction was in the range of 55% to 60%. Wall motion was normal; there were no regional wall motion abnormalities. Left ventricular diastolic function parameters were normal. Impressions: - Normal LV function; trace MR and TR; atrial septum appears  thickened on apical 4 chamber view.   ASSESSMENT & PLAN:   Palpitations:  Pulmonary sarcoidosis:  HLD:  Obesity:  Tobacco abuse:    Medication Adjustments/Labs and Tests Ordered: Current medicines are reviewed at length with the patient today.  Concerns regarding medicines are outlined above.  Medication changes, Labs and Tests ordered today are listed in the Patient Instructions below. There are no Patient Instructions on file for this visit.   Signed, Cline Crock, PA-C  03/20/2016 12:33 PM    [facility] [address] [place], [place], [place]  [address] Phone: [phone]; Fax: [phone]

## 2016-03-21 ENCOUNTER — Ambulatory Visit: Payer: BLUE CROSS/BLUE SHIELD | Admitting: Physician Assistant

## 2016-04-02 ENCOUNTER — Encounter: Payer: Self-pay | Admitting: Physician Assistant

## 2016-04-10 NOTE — Progress Notes (Signed)
Cardiology Office Note    Date:  04/11/2016   ID:  Aimee AppleVictoria F Roads, DOB 09/08/1965, MRN [id number]  PCP:  Lupita RaiderSHAW,KIMBERLEE, MD  Cardiologist:  Dr. Anne FuSkains  CC: fast heart rate  History of Present Illness:  Aimee Holmes is a 51 y.o. female with a history of pulmonary sarcoidosis, HLD, tobacco abuse, and obesity who presents to clinic for evaluation of fast HRs.   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 She had [facility] To her knowledge no biopsy done, she has had PFT, CTs and an elevated ACE level. Followed by Dr Delton CoombesByrum last seen in 2012. She has been in remission for this for quite sometime but felt like in early feb 2018 she may have had a flare but it went away quickly with super food vitamins.   She has a history of pulmonary sarcoidosis. In 2008 she had an echo that demonstrated right ventricular and right artrial enlargement and mildly elevated filling pressures, but subsequent right heart cath showed normal right sided pressures and no evidence of pulm HTN. Nuclear stress test 2011 low risk no ischemia, normal EF and echocardiogram in 2011 showing normal right ventricle. She wore a Holter monitor in 2011 that showed rare PACs, no tachy arrhythmias. Echocardiogram 03/28/14 showed normal right-sided pressures.  She was seen by Dr. Anne FuSkains in 05/2015 for follow up. She complained of atypical chest pain. Stress echo was ordered but never completed. It was felt she could follow up PRN.   Today she presents to clinic for evaluation of palpitations. She has not had anymore chest pain. She has had some palpitations like her heart is racing in the middle of the night. She thinks its related to laying on her left side. She is worried it is in her mind. It has lasted for hours and occurs every couple weeks. No CP or SOB. No LE edema, orthopnea or PND. No dizziness or syncope. No blood in stool or  urine. Still smoking 6 cigs a day. She is not on any medications, does not drink alcohol or taking other illegal drugs. She has gained some weight recently and is trying to loose it. She hasn't been exercising this winter and wants to get back into walking. She was a Engineer, siteschool teacher but now nannies. Doesn't feel like she has a lot of stress.    Past Medical History:  Diagnosis Date  . Allergic rhinitis   . Anxiety   . Hyperlipidemia   . Lymphadenopathy   . Obesity   . Sarcoidosis [facility](HCC)     Past Surgical History:  Procedure Laterality Date  . BLADDER SURGERY     tack    Current Medications: No outpatient prescriptions prior to visit.   No facility-administered medications prior to visit.      Allergies:   Patient has no known allergies.   Social History   Social History  . Marital status: Married    Spouse name: Loyal GamblerBarry Pickert  . Number of children: Y  . Years of education: N/A   Occupational History  . home maker    Social History Main Topics  . Smoking status: Current Every Day Smoker  . Smokeless tobacco: Never Used  . Alcohol use No  . Drug use: No  . Sexual activity: Not Asked   Other Topics Concern  . None   Social History Narrative  . None     Family History:  The patient's family history includes Breast cancer in her paternal aunt; Cancer in her paternal grandmother; Throat cancer in her paternal grandfather.      ROS:   Please see the history of present illness.    ROS All other systems reviewed and are negative.   PHYSICAL EXAM:   VS:  BP 134/70   Pulse 94   Ht 5\' 4"  (1.626 m)   Wt 181 lb 4 oz (82.2 kg)   SpO2 95%   BMI 31.11 kg/m    GEN: Well nourished, well developed, in no acute distress  HEENT: normal  Neck: no JVD, carotid bruits, or masses Cardiac: RRR; no murmurs, rubs, or gallops,no edema  Respiratory:  clear to auscultation bilaterally, normal work of breathing GI: soft, nontender, nondistended, + BS MS: no deformity or atrophy   Skin: warm and dry, no rash Neuro:  Alert and Oriented x 3, Strength and sensation are intact Psych: euthymic mood, full affect    Wt Readings from Last 3 Encounters:  04/11/16 181 lb 4 oz (82.2 kg)  06/09/15 178 lb (80.7 kg)  03/18/14 173 lb (78.5 kg)      Studies/Labs Reviewed:   EKG:  EKG is NOT ordered today.    Recent Labs: No results found for requested labs within last 8760 hours.   Lipid Panel    Component Value Date/Time   CHOL 129 09/16/2006 0855   TRIG 88 09/16/2006 0855   HDL 30.7 (L) 09/16/2006 0855   CHOLHDL 4.2 CALC 09/16/2006 0855   VLDL 18 09/16/2006 0855   LDLCALC 81 09/16/2006 0855    Additional studies/ records that were reviewed today include:  2D ECHO: 03/28/2014 LV EF: 55% -  60% Study Conclusions - Left ventricle: The cavity size was normal. Wall thickness was normal. Systolic function was normal. The estimated ejection fraction was in the range of 55% to 60%. Wall motion was normal; there were no regional wall motion abnormalities. Left ventricular diastolic function parameters were normal. Impressions: - Normal LV function; trace MR and TR; atrial septum appears thickened on apical 4 chamber view.   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 Clelia Croft will be checking lipids soon.   Obesity: Body mass index is 31.11 kg/m.   Tobacco abuse: still smoking 6 cigs a day. 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 Discussed importance of quitting    Medication Adjustments/Labs and Tests Ordered: Current medicines are reviewed at length with the patient today.  Concerns regarding medicines are outlined above.  Medication changes, Labs and Tests ordered today are listed in the Patient Instructions below. Patient Instructions  Medication Instructions:  Your physician  recommends that you continue on your current medications as directed. Please refer to the Current Medication list given to you today.   Labwork: None ordered  Testing/Procedures: Your physician has requested that you have an echocardiogram. Echocardiography is a painless test that uses sound waves to create images of your heart. It provides your doctor with information about the size and shape of your heart and how well your heart's chambers and valves are working. This procedure takes approximately one hour. There are no restrictions for this procedure.   Your physician has recommended that you wear an event monitor. Event monitors are medical devices that record the heart's electrical activity. Doctors most often  [place] these monitors to diagnose arrhythmias. Arrhythmias are problems with the speed or rhythm of the heartbeat. The monitor is a small, portable device. You can wear one while you do your normal daily activities. This is usually used to diagnose what is causing palpitations/syncope (passing out).  Follow-Up: Your physician recommends that you schedule a follow-up appointment in: DEPENDING UPON TEST RESULTS    Any Other Special Instructions Will Be Listed Below (If Applicable).   Cardiac Event Monitoring A cardiac event monitor is a small recording device that is used to detect abnormal heart rhythms (arrhythmias). The monitor is used to record your heart rhythm when you have symptoms, such as:  Fast heartbeats (palpitations), such as heart racing or fluttering.  Dizziness.  Fainting or light-headedness.  Unexplained weakness. Some monitors are wired to electrodes placed on your chest. Electrodes are flat, sticky disks that attach to your skin. Other monitors may be hand-held or worn on the wrist. The monitor can be worn for up to 30 days. If the monitor is attached to your chest, a technician will prepare your chest for the electrode placement and show you how to work the  monitor. Take time to practice using the monitor before you leave the office. Make sure you understand how to send the information from the monitor to your health care provider. In some cases, you may need to use a landline telephone instead of a cell phone. What are the risks? Generally, this device is safe to use, but it possible that the skin under the electrodes will become irritated. How to use your cardiac event monitor  Wear your monitor at all times, except when you are in water:  Do not let the monitor get wet.  Take the monitor off when you bathe. Do not swim or use a hot tub with it on.  Keep your skin clean. Do not put body lotion or moisturizer on your chest.  Change the electrodes as told by your health care provider or any time they stop sticking to your skin. You may need to use medical tape to keep them on.  Try to put the electrodes in slightly different places on your chest to help prevent skin irritation. They must remain in the area under your left breast and in the upper right section of your chest.  Make sure the monitor is safely clipped to your clothing or in a location close to your body that your health care provider recommends.  Press the button to record as soon as you feel heart-related symptoms, such as:  Dizziness.  Weakness.  Light-headedness.  Palpitations.  Thumping or pounding in your chest.  Shortness of breath.  Unexplained weakness.  Keep a diary of your activities, such as walking, doing chores, and taking medicine. It is very important to note what you were doing when you pushed the button to record your symptoms. This will help your health care provider determine what might be contributing to your symptoms.  Send the recorded information as recommended by your health care provider. It may take some time for your health care provider to process the results.  Change the batteries as told by your health care provider.  Keep electronic  devices away from your monitor. This includes:  Tablets.  MP3 players.  Cell phones.  While wearing your monitor you should avoid:  Electric blankets.  Firefighter.  Electric toothbrushes.  Microwave ovens.  Magnets.  Metal detectors. Get help right away if:  You have chest pain.  You have extreme difficulty breathing or shortness of breath.  You develop a very fast heartbeat that persists.  You develop dizziness that does not go away.  You faint or constantly feel like you are about to faint. Summary  A cardiac event monitor is a small recording device that is used to help detect abnormal heart rhythms (arrhythmias).  The monitor is used to record your heart rhythm when you have heart-related symptoms.  Make sure you understand how to send the information from the monitor to your health care provider.  It is important to press the button on the monitor when you have any heart-related symptoms.  Keep a diary of your activities, such as walking, doing chores, and taking medicine. It is very important to note what you were doing when you pushed the button to record your symptoms. This will help your health care provider learn what might be causing your symptoms. This information is not intended to replace advice given to you by your health care provider. Make sure you discuss any questions you have with your health care provider. Document Released: 10/10/2007 Document Revised: 12/16/2015 Document Reviewed: 12/16/2015 Elsevier Interactive Patient Education  2017 Elsevier Inc.    Echocardiogram An echocardiogram, or echocardiography, uses sound waves (ultrasound) to produce an image of your heart. The echocardiogram is simple, painless, obtained within a short period of time, and offers valuable information to your health care provider. The images from an echocardiogram can provide information such as:  Evidence of coronary artery disease (CAD).  Heart  size.  Heart muscle function.  Heart valve function.  Aneurysm detection.  Evidence of a past heart attack.  Fluid buildup around the heart.  Heart muscle thickening.  Assess heart valve function. Tell a health care provider about:  Any allergies you have.  All medicines you are taking, including vitamins, herbs, eye drops, creams, and over-the-counter medicines.  Any problems you or family members have had with anesthetic medicines.  Any blood disorders you have.  Any surgeries you have had.  Any medical conditions you have.  Whether you are pregnant or may be pregnant. What happens before the procedure? No special preparation is needed. Eat and drink normally. What happens during the procedure?  In order to produce an image of your heart, gel will be applied to your chest and a wand-like tool (transducer) will be moved over your chest. The gel will help transmit the sound waves from the transducer. The sound waves will harmlessly bounce off your heart to allow the heart images to be captured in real-time motion. These images will then be recorded.  You may need an IV to receive a medicine that improves the quality of the pictures. What happens after the procedure? You may return to your normal schedule including diet, activities, and medicines, unless your health care provider tells you otherwise. This information is not intended to replace advice given to you by your health care provider. Make sure you discuss any questions you have with your health care provider. Document Released: 12/29/1999 Document Revised: 08/19/2015 Document Reviewed: 09/07/2012 Elsevier Interactive Patient Education  2017 [employer].  If you need a refill on your cardiac medications before your next appointment, please call your pharmacy.      Signed, Cline Crock, PA-C  04/11/2016 4:01 PM    [facility] [address] [place], [place], [place]  [address] Phone: [phone]; Fax: [phone]

## 2016-04-11 ENCOUNTER — Encounter: Payer: Self-pay | Admitting: Physician Assistant

## 2016-04-11 ENCOUNTER — Encounter (INDEPENDENT_AMBULATORY_CARE_PROVIDER_SITE_OTHER): Payer: Self-pay

## 2016-04-11 ENCOUNTER — Ambulatory Visit (INDEPENDENT_AMBULATORY_CARE_PROVIDER_SITE_OTHER): Payer: BLUE CROSS/BLUE SHIELD | Admitting: Physician Assistant

## 2016-04-11 VITALS — BP 134/70 | HR 94 | Ht 64.0 in | Wt 181.2 lb

## 2016-04-11 DIAGNOSIS — E785 Hyperlipidemia, unspecified: Secondary | ICD-10-CM

## 2016-04-11 DIAGNOSIS — R002 Palpitations: Secondary | ICD-10-CM

## 2016-04-11 DIAGNOSIS — D869 Sarcoidosis, unspecified: Secondary | ICD-10-CM | POA: Diagnosis not present

## 2016-04-11 DIAGNOSIS — F17200 Nicotine dependence, unspecified, uncomplicated: Secondary | ICD-10-CM

## 2016-04-11 NOTE — Patient Instructions (Signed)
Medication Instructions:  Your physician recommends that you continue on your current medications as directed. Please refer to the Current Medication list given to you today.   Labwork: None ordered  Testing/Procedures: Your physician has requested that you have an echocardiogram. Echocardiography is a painless test that uses sound waves to create images of your heart. It provides your doctor with information about the size and shape of your heart and how well your heart's chambers and valves are working. This procedure takes approximately one hour. There are no restrictions for this procedure.   Your physician has recommended that you wear an event monitor. Event monitors are medical devices that record the heart's electrical activity. Doctors most often [place] these monitors to diagnose arrhythmias. Arrhythmias are problems with the speed or rhythm of the heartbeat. The monitor is a small, portable device. You can wear one while you do your normal daily activities. This is usually used to diagnose what is causing palpitations/syncope (passing out).  Follow-Up: Your physician recommends that you schedule a follow-up appointment in: DEPENDING UPON TEST RESULTS    Any Other Special Instructions Will Be Listed Below (If Applicable).   Cardiac Event Monitoring A cardiac event monitor is a small recording device that is used to detect abnormal heart rhythms (arrhythmias). The monitor is used to record your heart rhythm when you have symptoms, such as:  Fast heartbeats (palpitations), such as heart racing or fluttering.  Dizziness.  Fainting or light-headedness.  Unexplained weakness. Some monitors are wired to electrodes placed on your chest. Electrodes are flat, sticky disks that attach to your skin. Other monitors may be hand-held or worn on the wrist. The monitor can be worn for up to 30 days. If the monitor is attached to your chest, a technician will prepare your chest for the electrode  placement and show you how to work the monitor. Take time to practice using the monitor before you leave the office. Make sure you understand how to send the information from the monitor to your health care provider. In some cases, you may need to use a landline telephone instead of a cell phone. What are the risks? Generally, this device is safe to use, but it possible that the skin under the electrodes will become irritated. How to use your cardiac event monitor  Wear your monitor at all times, except when you are in water:  Do not let the monitor get wet.  Take the monitor off when you bathe. Do not swim or use a hot tub with it on.  Keep your skin clean. Do not put body lotion or moisturizer on your chest.  Change the electrodes as told by your health care provider or any time they stop sticking to your skin. You may need to use medical tape to keep them on.  Try to put the electrodes in slightly different places on your chest to help prevent skin irritation. They must remain in the area under your left breast and in the upper right section of your chest.  Make sure the monitor is safely clipped to your clothing or in a location close to your body that your health care provider recommends.  Press the button to record as soon as you feel heart-related symptoms, such as:  Dizziness.  Weakness.  Light-headedness.  Palpitations.  Thumping or pounding in your chest.  Shortness of breath.  Unexplained weakness.  Keep a diary of your activities, such as walking, doing chores, and taking medicine. It is very important to  note what you were doing when you pushed the button to record your symptoms. This will help your health care provider determine what might be contributing to your symptoms.  Send the recorded information as recommended by your health care provider. It may take some time for your health care provider to process the results.  Change the batteries as told by your  health care provider.  Keep electronic devices away from your monitor. This includes:  Tablets.  MP3 players.  Cell phones.  While wearing your monitor you should avoid:  Electric blankets.  Firefighter.  Electric toothbrushes.  Microwave ovens.  Magnets.  Metal detectors. Get help right away if:  You have chest pain.  You have extreme difficulty breathing or shortness of breath.  You develop a very fast heartbeat that persists.  You develop dizziness that does not go away.  You faint or constantly feel like you are about to faint. Summary  A cardiac event monitor is a small recording device that is used to help detect abnormal heart rhythms (arrhythmias).  The monitor is used to record your heart rhythm when you have heart-related symptoms.  Make sure you understand how to send the information from the monitor to your health care provider.  It is important to press the button on the monitor when you have any heart-related symptoms.  Keep a diary of your activities, such as walking, doing chores, and taking medicine. It is very important to note what you were doing when you pushed the button to record your symptoms. This will help your health care provider learn what might be causing your symptoms. This information is not intended to replace advice given to you by your health care provider. Make sure you discuss any questions you have with your health care provider. Document Released: 10/10/2007 Document Revised: 12/16/2015 Document Reviewed: 12/16/2015 Elsevier Interactive Patient Education  2017 Elsevier Inc.    Echocardiogram An echocardiogram, or echocardiography, uses sound waves (ultrasound) to produce an image of your heart. The echocardiogram is simple, painless, obtained within a short period of time, and offers valuable information to your health care provider. The images from an echocardiogram can provide information such as:  Evidence of coronary  artery disease (CAD).  Heart size.  Heart muscle function.  Heart valve function.  Aneurysm detection.  Evidence of a past heart attack.  Fluid buildup around the heart.  Heart muscle thickening.  Assess heart valve function. Tell a health care provider about:  Any allergies you have.  All medicines you are taking, including vitamins, herbs, eye drops, creams, and over-the-counter medicines.  Any problems you or family members have had with anesthetic medicines.  Any blood disorders you have.  Any surgeries you have had.  Any medical conditions you have.  Whether you are pregnant or may be pregnant. What happens before the procedure? No special preparation is needed. Eat and drink normally. What happens during the procedure?  In order to produce an image of your heart, gel will be applied to your chest and a wand-like tool (transducer) will be moved over your chest. The gel will help transmit the sound waves from the transducer. The sound waves will harmlessly bounce off your heart to allow the heart images to be captured in real-time motion. These images will then be recorded.  You may need an IV to receive a medicine that improves the quality of the pictures. What happens after the procedure? You may return to your normal schedule including diet, activities,  and medicines, unless your health care provider tells you otherwise. This information is not intended to replace advice given to you by your health care provider. Make sure you discuss any questions you have with your health care provider. Document Released: 12/29/1999 Document Revised: 08/19/2015 Document Reviewed: 09/07/2012 Elsevier Interactive Patient Education  2017 [employer]  If you need a refill on your cardiac medications before your next appointment, please call your pharmacy.

## 2016-04-26 ENCOUNTER — Other Ambulatory Visit (HOSPITAL_COMMUNITY): Payer: BLUE CROSS/BLUE SHIELD

## 2016-06-21 DIAGNOSIS — Z6832 Body mass index (BMI) 32.0-32.9, adult: Secondary | ICD-10-CM | POA: Diagnosis not present

## 2016-06-21 DIAGNOSIS — Z124 Encounter for screening for malignant neoplasm of cervix: Secondary | ICD-10-CM | POA: Diagnosis not present

## 2016-06-21 DIAGNOSIS — Z01419 Encounter for gynecological examination (general) (routine) without abnormal findings: Secondary | ICD-10-CM | POA: Diagnosis not present

## 2016-06-21 DIAGNOSIS — Z1231 Encounter for screening mammogram for malignant neoplasm of breast: Secondary | ICD-10-CM | POA: Diagnosis not present

## 2016-08-05 ENCOUNTER — Other Ambulatory Visit (HOSPITAL_COMMUNITY): Payer: Self-pay | Admitting: Respiratory Therapy

## 2016-08-05 ENCOUNTER — Telehealth: Payer: Self-pay | Admitting: Physician Assistant

## 2016-08-05 DIAGNOSIS — D869 Sarcoidosis, unspecified: Secondary | ICD-10-CM

## 2016-08-05 DIAGNOSIS — Z23 Encounter for immunization: Secondary | ICD-10-CM | POA: Diagnosis not present

## 2016-08-05 DIAGNOSIS — Z13220 Encounter for screening for lipoid disorders: Secondary | ICD-10-CM | POA: Diagnosis not present

## 2016-08-05 DIAGNOSIS — Z0000 Encounter for general adult medical examination without abnormal findings: Secondary | ICD-10-CM | POA: Diagnosis not present

## 2016-08-05 NOTE — Telephone Encounter (Signed)
New message  Pt was at [facility] physicians and she wants to have the past cancled echos rescheduled  Pt needs a new order for ECHO

## 2016-08-05 NOTE — Telephone Encounter (Signed)
  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 Clelia CroftShaw will be checking lipids soon.   Obesity: Body mass index is 31.11 kg/m.   Tobacco abuse: still smoking 6 cigs a day. 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 Discussed importance of quitting  New order for echo is in the system and a [facility] to call the pt and reschedule this appt.  This order was placed at the pts last OV with Bary CastillaKaty Thompson PA-C back in March 2018.  Above is Katy's last OV note with the pt. [facility] her back with this appt.  Pt verbalized understanding and agrees with this plan.

## 2016-08-06 ENCOUNTER — Telehealth: Payer: Self-pay | Admitting: Physician Assistant

## 2016-08-06 NOTE — Telephone Encounter (Signed)
Called and LVM for the patient to call back to schedule her echo.

## 2016-08-12 ENCOUNTER — Ambulatory Visit (HOSPITAL_COMMUNITY)
Admission: RE | Admit: 2016-08-12 | Discharge: 2016-08-12 | Disposition: A | Discharge status: Home / Self Care | Payer: BLUE CROSS/BLUE SHIELD | Source: Ambulatory Visit | Attending: Advanced Practice Midwife | Admitting: Advanced Practice Midwife

## 2016-08-12 DIAGNOSIS — D869 Sarcoidosis, unspecified: Secondary | ICD-10-CM | POA: Insufficient documentation

## 2016-08-12 MED ORDER — ALBUTEROL SULFATE (2.5 MG/3ML) 0.083% IN NEBU
2.5000 mg | INHALATION_SOLUTION | Freq: Once | RESPIRATORY_TRACT | Status: AC
  Administered 2016-08-12: 2.5 mg via RESPIRATORY_TRACT

## 2016-08-16 ENCOUNTER — Other Ambulatory Visit: Payer: Self-pay

## 2016-08-16 ENCOUNTER — Ambulatory Visit (HOSPITAL_COMMUNITY): Payer: BLUE CROSS/BLUE SHIELD | Attending: Cardiology

## 2016-08-16 DIAGNOSIS — I340 Nonrheumatic mitral (valve) insufficiency: Secondary | ICD-10-CM | POA: Diagnosis not present

## 2016-08-16 DIAGNOSIS — D869 Sarcoidosis, unspecified: Secondary | ICD-10-CM

## 2016-08-18 LAB — PULMONARY FUNCTION TEST
DL/VA % pred: 88 %
DL/VA: 4.19 ml/min/mmHg/L
DLCO unc % pred: 72 %
DLCO unc: 17.11 ml/min/mmHg
FEF 25-75 PRE: 2.91 L/s
FEF 25-75 Post: 3.19 L/sec
FEF2575-%Change-Post: 9 %
FEF2575-%PRED-PRE: 127 %
FEF2575-%Pred-Post: 139 %
FEV1-%CHANGE-POST: 2 %
FEV1-%PRED-PRE: 109 %
FEV1-%Pred-Post: 112 %
FEV1-Post: 2.47 L
FEV1-Pre: 2.41 L
FEV1FVC-%Change-Post: 0 %
FEV1FVC-%Pred-Pre: 105 %
FEV6-%CHANGE-POST: 2 %
FEV6-%PRED-POST: 107 %
FEV6-%PRED-PRE: 105 %
FEV6-POST: 2.89 L
FEV6-PRE: 2.81 L
FEV6FVC-%PRED-POST: 103 %
FEV6FVC-%PRED-PRE: 103 %
FVC-%CHANGE-POST: 2 %
FVC-%PRED-POST: 104 %
FVC-%Pred-Pre: 101 %
FVC-Post: 2.89 L
FVC-Pre: 2.81 L
POST FEV6/FVC RATIO: 100 %
PRE FEV6/FVC RATIO: 100 %
Post FEV1/FVC ratio: 85 %
Pre FEV1/FVC ratio: 85 %
RV % PRED: 99 %
RV: 1.83 L
TLC % PRED: 97 %
TLC: 4.88 L

## 2016-08-19 ENCOUNTER — Telehealth: Payer: Self-pay | Admitting: Physician Assistant

## 2016-08-19 NOTE — Telephone Encounter (Signed)
F/u message  Pt returning RN call about echo. Please call back to discuss  

## 2017-02-25 ENCOUNTER — Telehealth: Payer: Self-pay | Admitting: Cardiology

## 2017-02-25 NOTE — Telephone Encounter (Signed)
Patient complaining of chest pain that radiates up her right shoulder and neck. Patient stated her BP has been up and she has nausea. Patient stated this has been going on and off since Friday. Patient stated she is having this pain right now, but she thinks it might be sore muscle from shoveling on Friday. Encouraged patient to go to the ED with CP. Patient refusing to go to ED. Informed patient that an appointment can be made with one of our NP. Patient preferred to wait for an appointment instead of going to the ED or urgent care. Informed patient that going to the ED is a better option to rule out an MI. (labs, ekg, etc.) Patient stated if she feels worse she will go to ED, but right now she is refusing.  Made patient an appointment with Aimee BoozerLaura Ingold NP on Thursday to be evaluated. Patient verbalized understanding and will go to ED if symptoms become worse.

## 2017-02-25 NOTE — Telephone Encounter (Signed)
Pt c/o of Chest Pain: STAT if CP now or developed within 24 hours  1. Are you having CP right now? Chest pressure.  2. Are you experiencing any other symptoms (ex. SOB, nausea, vomiting, sweating)? Pain in R shoulder, nausea Sunday, pain side of neck both. bp 150/80 Sunday .145/8? Today ??  3. How long have you been experiencing CP? 2days  4. Is your CP continuous or coming and going? Off and on   5. Have you taken Nitroglycerin? asprin

## 2017-02-27 ENCOUNTER — Ambulatory Visit: Payer: BLUE CROSS/BLUE SHIELD | Admitting: Cardiology

## 2017-05-05 DIAGNOSIS — R5383 Other fatigue: Secondary | ICD-10-CM | POA: Diagnosis not present

## 2017-06-24 DIAGNOSIS — Z1231 Encounter for screening mammogram for malignant neoplasm of breast: Secondary | ICD-10-CM | POA: Diagnosis not present

## 2017-06-24 DIAGNOSIS — R87610 Atypical squamous cells of undetermined significance on cytologic smear of cervix (ASC-US): Secondary | ICD-10-CM | POA: Diagnosis not present

## 2017-06-24 DIAGNOSIS — Z6831 Body mass index (BMI) 31.0-31.9, adult: Secondary | ICD-10-CM | POA: Diagnosis not present

## 2017-06-24 DIAGNOSIS — Z01419 Encounter for gynecological examination (general) (routine) without abnormal findings: Secondary | ICD-10-CM | POA: Diagnosis not present

## 2017-06-24 DIAGNOSIS — B009 Herpesviral infection, unspecified: Secondary | ICD-10-CM | POA: Diagnosis not present

## 2017-06-24 DIAGNOSIS — Z124 Encounter for screening for malignant neoplasm of cervix: Secondary | ICD-10-CM | POA: Diagnosis not present

## 2017-07-07 DIAGNOSIS — R87610 Atypical squamous cells of undetermined significance on cytologic smear of cervix (ASC-US): Secondary | ICD-10-CM | POA: Diagnosis not present

## 2017-08-12 DIAGNOSIS — N390 Urinary tract infection, site not specified: Secondary | ICD-10-CM | POA: Diagnosis not present

## 2017-09-01 ENCOUNTER — Other Ambulatory Visit: Payer: Self-pay | Admitting: Family Medicine

## 2017-09-01 ENCOUNTER — Ambulatory Visit
Admission: RE | Admit: 2017-09-01 | Discharge: 2017-09-01 | Disposition: A | Discharge status: Home / Self Care | Payer: BLUE CROSS/BLUE SHIELD | Source: Ambulatory Visit | Attending: Family Medicine | Admitting: Family Medicine

## 2017-09-01 DIAGNOSIS — R7301 Impaired fasting glucose: Secondary | ICD-10-CM | POA: Diagnosis not present

## 2017-09-01 DIAGNOSIS — D869 Sarcoidosis, unspecified: Secondary | ICD-10-CM

## 2017-09-01 DIAGNOSIS — R42 Dizziness and giddiness: Secondary | ICD-10-CM | POA: Diagnosis not present

## 2017-09-01 DIAGNOSIS — Z13220 Encounter for screening for lipoid disorders: Secondary | ICD-10-CM | POA: Diagnosis not present

## 2017-09-01 DIAGNOSIS — D862 Sarcoidosis of lung with sarcoidosis of lymph nodes: Secondary | ICD-10-CM | POA: Diagnosis not present

## 2017-09-01 DIAGNOSIS — D860 Sarcoidosis of lung: Secondary | ICD-10-CM | POA: Diagnosis not present

## 2017-09-01 DIAGNOSIS — Z0000 Encounter for general adult medical examination without abnormal findings: Secondary | ICD-10-CM | POA: Diagnosis not present

## 2017-09-02 ENCOUNTER — Emergency Department (HOSPITAL_COMMUNITY): Payer: BLUE CROSS/BLUE SHIELD

## 2017-09-02 ENCOUNTER — Encounter (HOSPITAL_COMMUNITY): Payer: Self-pay | Admitting: Emergency Medicine

## 2017-09-02 ENCOUNTER — Other Ambulatory Visit: Payer: Self-pay

## 2017-09-02 ENCOUNTER — Emergency Department (HOSPITAL_COMMUNITY)
Admission: EM | Admit: 2017-09-02 | Discharge: 2017-09-02 | Disposition: A | Discharge status: Home / Self Care | Payer: BLUE CROSS/BLUE SHIELD | Attending: Emergency Medicine | Admitting: Emergency Medicine

## 2017-09-02 DIAGNOSIS — H539 Unspecified visual disturbance: Secondary | ICD-10-CM | POA: Diagnosis not present

## 2017-09-02 DIAGNOSIS — R5383 Other fatigue: Secondary | ICD-10-CM | POA: Diagnosis not present

## 2017-09-02 DIAGNOSIS — H538 Other visual disturbances: Secondary | ICD-10-CM

## 2017-09-02 DIAGNOSIS — Z79899 Other long term (current) drug therapy: Secondary | ICD-10-CM | POA: Insufficient documentation

## 2017-09-02 DIAGNOSIS — F17210 Nicotine dependence, cigarettes, uncomplicated: Secondary | ICD-10-CM | POA: Diagnosis not present

## 2017-09-02 LAB — CBC
HCT: 41.3 % (ref 36.0–46.0)
Hemoglobin: 13.3 g/dL (ref 12.0–15.0)
MCH: 30.6 pg (ref 26.0–34.0)
MCHC: 32.2 g/dL (ref 30.0–36.0)
MCV: 94.9 fL (ref 78.0–100.0)
Platelets: 223 10*3/uL (ref 150–400)
RBC: 4.35 MIL/uL (ref 3.87–5.11)
RDW: 12.5 % (ref 11.5–15.5)
WBC: 6.7 10*3/uL (ref 4.0–10.5)

## 2017-09-02 LAB — COMPREHENSIVE METABOLIC PANEL
ALBUMIN: 4.1 g/dL (ref 3.5–5.0)
ALK PHOS: 74 U/L (ref 38–126)
ALT: 16 U/L (ref 0–44)
AST: 18 U/L (ref 15–41)
Anion gap: 10 (ref 5–15)
BILIRUBIN TOTAL: 0.6 mg/dL (ref 0.3–1.2)
BUN: 12 mg/dL (ref 6–20)
CO2: 25 mmol/L (ref 22–32)
Calcium: 9.1 mg/dL (ref 8.9–10.3)
Chloride: 105 mmol/L (ref 98–111)
Creatinine, Ser: 0.63 mg/dL (ref 0.44–1.00)
GFR calc non Af Amer: >60 mL/min (ref >60)
GLUCOSE: 110 mg/dL — AB (ref 70–99)
Potassium: 3.8 mmol/L (ref 3.5–5.1)
Sodium: 140 mmol/L (ref 135–145)
Total Protein: 7 g/dL (ref 6.5–8.1)

## 2017-09-02 LAB — I-STAT BETA HCG BLOOD, ED (MC, WL, AP ONLY): I-stat hCG, quantitative: <5 m[IU]/mL (ref <5)

## 2017-09-02 LAB — DIFFERENTIAL
Abs Immature Granulocytes: 0 10*3/uL (ref 0.0–0.1)
Basophils Absolute: 0.1 10*3/uL (ref 0.0–0.1)
Basophils Relative: 1 %
Eosinophils Absolute: 0.1 10*3/uL (ref 0.0–0.7)
Eosinophils Relative: 2 %
Immature Granulocytes: 0 %
LYMPHS ABS: 2.9 10*3/uL (ref 0.7–4.0)
LYMPHS PCT: 43 %
MONO ABS: 0.4 10*3/uL (ref 0.1–1.0)
Monocytes Relative: 6 %
Neutro Abs: 3.2 10*3/uL (ref 1.7–7.7)
Neutrophils Relative %: 48 %

## 2017-09-02 LAB — I-STAT CHEM 8, ED
BUN: 14 mg/dL (ref 6–20)
CHLORIDE: 104 mmol/L (ref 98–111)
Calcium, Ion: 1.12 mmol/L — ABNORMAL LOW (ref 1.15–1.40)
Creatinine, Ser: 0.6 mg/dL (ref 0.44–1.00)
Glucose, Bld: 106 mg/dL — ABNORMAL HIGH (ref 70–99)
HCT: 41 % (ref 36.0–46.0)
Hemoglobin: 13.9 g/dL (ref 12.0–15.0)
POTASSIUM: 3.7 mmol/L (ref 3.5–5.1)
SODIUM: 138 mmol/L (ref 135–145)
TCO2: 24 mmol/L (ref 22–32)

## 2017-09-02 LAB — I-STAT TROPONIN, ED: Troponin i, poc: 0 ng/mL (ref 0.00–0.08)

## 2017-09-02 LAB — APTT: aPTT: 30 seconds (ref 24–36)

## 2017-09-02 LAB — PROTIME-INR
INR: 0.97
Prothrombin Time: 12.8 seconds (ref 11.4–15.2)

## 2017-09-02 MED ORDER — SODIUM CHLORIDE 0.9 % IV BOLUS: 1000.0000 mL | Freq: Once | INTRAVENOUS | Status: DC

## 2017-09-02 NOTE — ED Notes (Signed)
Signature pad not available at time of pt discharge. Pt verbalized understanding of d/c instructions.

## 2017-09-02 NOTE — ED Triage Notes (Addendum)
Patient to ED c/o head fogginess x 3-4 days, worse last night and today as well as new (L-sided) visual change onset about 9am today. No extremity weakness, numbness, or tingling at this time. Grip strength strong and equal. She states her vision is just not as clear as usual and she has a slight headache. Saw her PCP yesterday and was told to call if her symptoms worsened. No focal symptoms noted.

## 2017-09-02 NOTE — ED Provider Notes (Signed)
52 year old female presenting to the ED with intermittent "foggy headedness" for the last few weeks and acute bilateral frontal headache, dizziness, and blurred vision in the left eye, onset approximately 2.5 to 3 hours ago.  She reports that she has been following a keto diet for the last few weeks.  She was seen by her PCP yesterday for physical who advised her to eat some simple sugars and protein if her symptoms worsen.  She reports that she ate some peanut butter toast this morning as she was continuing to feel foggy headed.   GCS 15.  Alert and oriented x3.  Cranial nerves II through XII are grossly intact.  No dysmetria with finger-to-nose or heel-to-shin bilaterally.  Symmetric tandem gait.  Normal toe walking.  Good strength against resistance of the large muscle groups of the bilateral upper and lower extremities.  Pupils are equal round and reactive.  Horizontal nystagmus bilaterally.  Visual fields are intact and symmetric.  She is able to finger count, but vision is more blurred in the left eye.  No blurred vision in the right eye.   Frederik PearMcDonald, Charlita Brian A, PA-C 09/02/17 1137    Rolan BuccoBelfi, Melanie, MD 09/02/17 [phone]

## 2017-09-02 NOTE — ED Provider Notes (Signed)
[facility] EMERGENCY DEPARTMENT Provider Note   CSN: [id number] Arrival date & time: 09/02/17  1051     History   Chief Complaint Chief Complaint  Patient presents with  . Dizziness  . Blurred Vision    HPI Aimee Holmes is a 52 y.o. female past medical history of hyperlipidemia, sarcoidosis who presents emergency department today for blurred vision.  Patient reports that she has been having ongoing fogginess of her head for last 1 month.  She was seen by her PCP yesterday for this and had reassuring blood work.  She was told if she had any new symptoms to go to the emergency department.  She reports she woke up this morning and had a dull, achy bilateral temporal headache associated tightness in her cheeks as well as blurring of her vision bilaterally which she says may have been worsening of the left eye.  She reports that she felt dizzy but denies sensation of room spinning or vertigo.  She states that she felt like she was going to pass out at nighttime had tunneling of her vision.  She notes that her symptoms lasted for approximately 2-3 hours before completely resolving.  She now is without headache, changes or vision.  She reports she has ongoing fogginess of her head that has been present for the last 1 month.  She denies any associated syncope, head trauma, history of migraines, photophobia, phonophobia, neck pain, jaw claudication, thunderclap onset, facial droop, difficulty with speech, hearing changes, tinnitus, numbness/tingling of the face, numbness/tingling/weakness of the extremities, focal weakness, difficulty with gait. No one in party with similar HA or exposure that would suggest CO poisoning.  Nuys any sinus pressure, rhinorrhea, eye trauma, eye pain, painful extract movements, eye redness, chest pain, shortness of breath, cough, abdominal pain, nausea/vomiting/diarrhea, urinary symptoms. Patient reports she did recently start a new keto diet.    HPI  Past Medical History:  Diagnosis Date  . Allergic rhinitis   . Anxiety   . Hyperlipidemia   . Lymphadenopathy   . Obesity   . Sarcoidosis     Patient Active Problem List   Diagnosis Date Noted  . TOBACCO ABUSE 09/22/2009  . SARCOIDOSIS, PULMONARY 09/17/2006  . HYPERLIPIDEMIA 09/17/2006  . OBESITY 09/17/2006  . ANXIETY 09/17/2006  . ALLERGIC RHINITIS 09/17/2006  . LYMPHADENOPATHY 09/17/2006    Past Surgical History:  Procedure Laterality Date  . BLADDER SURGERY     tack     OB History   None      Home Medications    Prior to Admission medications   Medication Sig Start Date End Date Taking? Authorizing Provider  Multiple Vitamin (MULTIVITAMIN WITH MINERALS) TABS tablet Take 1 tablet by mouth daily.   Yes [provider]  naproxen sodium (ALEVE) 220 MG tablet Take 220 mg by mouth daily as needed (pain).   Yes [provider]    Family History Family History  Problem Relation Age of Onset  . Breast cancer Paternal Aunt   . Throat cancer Paternal Grandfather        non smoker  . Cancer Paternal Grandmother     Social History Social History   Tobacco Use  . Smoking status: Current Every Day Smoker    Packs/day: 0.50    Types: Cigarettes  . Smokeless tobacco: Never Used  Substance Use Topics  . Alcohol use: No  . Drug use: No     Allergies   Patient has no known allergies.  Review of Systems Review of Systems  All other systems reviewed and are negative.    Physical Exam Updated Vital Signs BP 120/76 (BP Location: Right Arm)   Pulse (!) 55   Temp 98.6 F (37 C) (Oral)   Resp 20   SpO2 95%   Physical Exam  Constitutional: She appears well-developed and well-nourished.  HENT:  Head: Normocephalic and atraumatic.  Right Ear: External ear normal.  Left Ear: External ear normal.  Nose: Nose normal.  Mouth/Throat: Uvula is midline, oropharynx is clear and moist and mucous membranes are normal. No tonsillar  exudate.  Eyes: Pupils are equal, round, and reactive to light. Right eye exhibits no discharge. Left eye exhibits no discharge. No scleral icterus.  Neck: Trachea normal. Neck supple. No JVD present. No spinous process tenderness and no muscular tenderness present. Carotid bruit is not present. No neck rigidity. Normal range of motion present.  Cardiovascular: Normal rate, regular rhythm and intact distal pulses.  No murmur heard. Pulses:      Radial pulses are 2+ on the right side, and 2+ on the left side.       Dorsalis pedis pulses are 2+ on the right side, and 2+ on the left side.       Posterior tibial pulses are 2+ on the right side, and 2+ on the left side.  No lower extremity swelling or edema. Calves symmetric in size bilaterally.  Pulmonary/Chest: Effort normal and breath sounds normal. She exhibits no tenderness.  Abdominal: Soft. Bowel sounds are normal. There is no tenderness. There is no rebound and no guarding.  Musculoskeletal: She exhibits no edema.  Lymphadenopathy:    She has no cervical adenopathy.  Neurological: She is alert.  Mental Status:  Alert, oriented, thought content appropriate, able to give a coherent history. Speech fluent without evidence of aphasia. Able to follow 2 step commands without difficulty.  Cranial Nerves:  II:  Peripheral visual fields grossly normal, pupils equal, round, reactive to light III,IV, VI: ptosis not present, extra-ocular motions intact bilaterally  V,VII: smile symmetric, eyebrows raise symmetric, facial light touch sensation equal VIII: hearing grossly normal to voice  X: uvula elevates symmetrically  XI: bilateral shoulder shrug symmetric and strong XII: midline tongue extension without fassiculations Motor:  Normal tone. 5/5 in upper and lower extremities bilaterally including strong and equal grip strength and dorsiflexion/plantar flexion Sensory: Sensation intact to light touch in all extremities. Negative Romberg.  Deep  Tendon Reflexes: 2+ and symmetric in the biceps and patella Cerebellar: normal finger-to-nose with bilateral upper extremities. Normal heel-to -shin balance bilaterally of the lower extremity. No pronator drift.  Gait: normal gait and balance CV: distal pulses palpable throughout   Skin: Skin is warm and dry. No rash noted. She is not diaphoretic.  Psychiatric: She has a normal mood and affect.  Nursing note and vitals reviewed.    ED Treatments / Results  Labs (all labs ordered are listed, but only abnormal results are displayed) Labs Reviewed  COMPREHENSIVE METABOLIC PANEL - Abnormal; Notable for the following components:      Result Value   Glucose, Bld 110 (*)    All other components within normal limits  I-STAT CHEM 8, ED - Abnormal; Notable for the following components:   Glucose, Bld 106 (*)    Calcium, Ion 1.12 (*)    All other components within normal limits  PROTIME-INR  APTT  CBC  DIFFERENTIAL  I-STAT TROPONIN, ED  CBG MONITORING, ED  I-STAT BETA HCG  BLOOD, ED ([facility], [facility], [facility] ONLY)    EKG EKG Interpretation  Date/Time:  Tuesday September 02 2017 10:58:36 EDT Ventricular Rate:  74 PR Interval:  126 QRS Duration: 72 QT Interval:  392 QTC Calculation: 435 R Axis:   61 Text Interpretation:  Normal sinus rhythm Nonspecific ST abnormality Abnormal ECG Confirmed by Virgina NorfolkAdam, [place] [phone]([id number]) on 09/02/2017 3:17:36 PM   Radiology Dg Chest 2 View  Result Date: 09/01/2017 CLINICAL DATA:  Sarcoidosis EXAM: CHEST - 2 VIEW COMPARISON:  12/17/2010 FINDINGS: Normal heart size and mediastinal contours. No infiltrate or edema. No effusion or pneumothorax. No acute osseous findings. IMPRESSION: Negative chest. Electronically Signed   By: Marnee SpringJonathon  Watts M.D.   On: 09/01/2017 10:29   Ct Head Wo Contrast  Result Date: 09/02/2017 CLINICAL DATA:  Left side visual changes. EXAM: CT HEAD WITHOUT CONTRAST TECHNIQUE: Contiguous axial images were obtained from the base of the skull through the  vertex without intravenous contrast. COMPARISON:  None. FINDINGS: Brain: No acute intracranial abnormality. Specifically, no hemorrhage, hydrocephalus, mass lesion, acute infarction, or significant intracranial injury. Vascular: No hyperdense vessel or unexpected calcification. Skull: No acute calvarial abnormality. Sinuses/Orbits: Visualized paranasal sinuses and mastoids clear. Orbital soft tissues unremarkable. Other: None IMPRESSION: Normal study. Electronically Signed   By: Charlett NoseKevin  Dover M.D.   On: 09/02/2017 12:02    Procedures Procedures (including critical care time)  Medications Ordered in ED Medications - No data to display   Initial Impression / Assessment and Plan / ED Course  I have reviewed the triage vital signs and the nursing notes.  Pertinent labs & imaging results that were available during my care of the patient were reviewed by me and considered in my medical decision making (see chart for details).     52 y.o. female who presents the emergent department with sensation of fogginess in her head over the last several weeks.  She was seen by her PCP yesterday and had unremarkable blood work and urinalysis.  She states that this morning had the sensation she was going to pass out and had some visual blurriness.  She denies any neurologic symptoms. No diplopia. This has resolved. She is currently asymptomatic. No new medications.  She denies any drug use.  Neurologic exam is unremarkable.  CT scan of the head and blood work unremarkable.  No concern for stroke or intracranial process at this time.  Do not feel she needs a CTA to evaluate for dissection.  EKG within normal limits.  Troponin within normal limits.  No evidence of a AKI or electrolight abnormalities.  Patient denies any chest pain, shortness breath, abdominal pain, nausea/vomiting/diarrhea, fever or other systemic symptoms.  She is currently asymptomatic.  The evaluation does not show pathology that would require  ongoing emergent intervention or inpatient treatment. I advised the patient to follow-up with PCP this week. I advised the patient to return to the emergency department with new or worsening symptoms or new concerns. Specific return precautions discussed. The patient verbalized understanding and agreement with plan. All questions answered. No further questions at this time. The patient is hemodynamically stable, mentating appropriately and appears safe for discharge.  Final Clinical Impressions(s) / ED Diagnoses   Final diagnoses:  Fatigue, unspecified type  Blurred vision    ED Discharge Orders    None       Princella PellegriniMaczis, Michael M, PA-C 09/02/17 2204    Virgina Norfolkuratolo, Adam, DO 09/02/17 2209

## 2017-09-02 NOTE — Discharge Instructions (Signed)
You were seen here today blurred vision, sensation of passing out and headache.  Your workup today was reassuring.  You will need to follow up with your doctor by the end of the week to have a recheck.  You may benefit from further blood work as an outpatient (TSH etc) Please eat 3 rounds of meals per day and stay well hydrated.   SEEK IMMEDIATE MEDICAL CARE IF: You have severe eye pain. You have a severe headache. You develop any changes in her vision again You have any difficulty with speech Your changes in your hearing You have any weakness of the extremities If any numbness of your face or extremities You develop any neck pain You develop any fever You have difficulty with gait You develop any chest pain, shortness of breath, abdominal pain You have flashing lights in your field of vision. You have a sudden change in vision. You have a sudden loss of vision. You have vision change after an injury. You notice drainage coming from your eyes. You notice a rash around your eyes.

## 2017-12-04 DIAGNOSIS — E782 Mixed hyperlipidemia: Secondary | ICD-10-CM | POA: Diagnosis not present

## 2017-12-04 DIAGNOSIS — R7301 Impaired fasting glucose: Secondary | ICD-10-CM | POA: Diagnosis not present

## 2018-08-03 DIAGNOSIS — F4322 Adjustment disorder with anxiety: Secondary | ICD-10-CM | POA: Diagnosis not present

## 2018-09-24 DIAGNOSIS — R7301 Impaired fasting glucose: Secondary | ICD-10-CM | POA: Diagnosis not present

## 2018-09-24 DIAGNOSIS — D860 Sarcoidosis of lung: Secondary | ICD-10-CM | POA: Diagnosis not present

## 2018-09-28 DIAGNOSIS — Z0000 Encounter for general adult medical examination without abnormal findings: Secondary | ICD-10-CM | POA: Diagnosis not present

## 2018-09-30 ENCOUNTER — Other Ambulatory Visit: Payer: Self-pay | Admitting: Family Medicine

## 2018-09-30 DIAGNOSIS — Z1231 Encounter for screening mammogram for malignant neoplasm of breast: Secondary | ICD-10-CM

## 2018-10-21 ENCOUNTER — Other Ambulatory Visit (HOSPITAL_COMMUNITY)
Admission: RE | Admit: 2018-10-21 | Discharge: 2018-10-21 | Disposition: A | Discharge status: Home / Self Care | Payer: BC Managed Care – PPO | Source: Ambulatory Visit | Attending: Family Medicine | Admitting: Family Medicine

## 2018-10-21 ENCOUNTER — Other Ambulatory Visit: Payer: Self-pay | Admitting: Family Medicine

## 2018-10-21 DIAGNOSIS — Z124 Encounter for screening for malignant neoplasm of cervix: Secondary | ICD-10-CM | POA: Diagnosis not present

## 2018-11-02 LAB — CYTOLOGY - PAP
Comment: NEGATIVE
Diagnosis: NEGATIVE
High risk HPV: NEGATIVE

## 2018-11-16 ENCOUNTER — Ambulatory Visit: Payer: Self-pay

## 2018-12-02 ENCOUNTER — Other Ambulatory Visit: Payer: Self-pay

## 2018-12-02 DIAGNOSIS — Z20822 Contact with and (suspected) exposure to covid-19: Secondary | ICD-10-CM

## 2018-12-04 LAB — NOVEL CORONAVIRUS, NAA: SARS-CoV-2, NAA: NOT DETECTED

## 2018-12-17 ENCOUNTER — Other Ambulatory Visit: Payer: Self-pay

## 2018-12-17 DIAGNOSIS — Z20822 Contact with and (suspected) exposure to covid-19: Secondary | ICD-10-CM

## 2018-12-20 LAB — NOVEL CORONAVIRUS, NAA: SARS-CoV-2, NAA: NOT DETECTED

## 2019-02-07 DIAGNOSIS — Z20828 Contact with and (suspected) exposure to other viral communicable diseases: Secondary | ICD-10-CM | POA: Diagnosis not present

## 2019-02-07 DIAGNOSIS — Z20822 Contact with and (suspected) exposure to covid-19: Secondary | ICD-10-CM | POA: Diagnosis not present

## 2019-05-12 ENCOUNTER — Other Ambulatory Visit (HOSPITAL_COMMUNITY): Payer: Self-pay | Admitting: Respiratory Therapy

## 2019-05-12 DIAGNOSIS — D860 Sarcoidosis of lung: Secondary | ICD-10-CM

## 2019-05-28 ENCOUNTER — Ambulatory Visit
Admission: RE | Admit: 2019-05-28 | Discharge: 2019-05-28 | Disposition: A | Discharge status: Home / Self Care | Payer: BC Managed Care – PPO | Source: Ambulatory Visit | Attending: Family Medicine | Admitting: Family Medicine

## 2019-05-28 ENCOUNTER — Other Ambulatory Visit: Payer: Self-pay

## 2019-05-28 DIAGNOSIS — Z1231 Encounter for screening mammogram for malignant neoplasm of breast: Secondary | ICD-10-CM

## 2019-06-01 ENCOUNTER — Other Ambulatory Visit (HOSPITAL_COMMUNITY)
Admission: RE | Admit: 2019-06-01 | Discharge: 2019-06-01 | Disposition: A | Discharge status: Home / Self Care | Payer: BC Managed Care – PPO | Source: Ambulatory Visit | Attending: Family Medicine | Admitting: Family Medicine

## 2019-06-01 DIAGNOSIS — Z20822 Contact with and (suspected) exposure to covid-19: Secondary | ICD-10-CM | POA: Diagnosis not present

## 2019-06-01 DIAGNOSIS — Z01812 Encounter for preprocedural laboratory examination: Secondary | ICD-10-CM | POA: Diagnosis not present

## 2019-06-01 LAB — SARS CORONAVIRUS 2 (TAT 6-24 HRS): SARS Coronavirus 2: NEGATIVE

## 2019-06-04 ENCOUNTER — Inpatient Hospital Stay (HOSPITAL_COMMUNITY): Admission: RE | Admit: 2019-06-04 | Payer: BC Managed Care – PPO | Source: Ambulatory Visit

## 2019-06-24 ENCOUNTER — Other Ambulatory Visit (HOSPITAL_COMMUNITY)
Admission: RE | Admit: 2019-06-24 | Discharge: 2019-06-24 | Disposition: A | Discharge status: Home / Self Care | Payer: BC Managed Care – PPO | Source: Ambulatory Visit | Attending: Family Medicine | Admitting: Family Medicine

## 2019-06-24 DIAGNOSIS — Z20822 Contact with and (suspected) exposure to covid-19: Secondary | ICD-10-CM | POA: Diagnosis not present

## 2019-06-24 DIAGNOSIS — Z01812 Encounter for preprocedural laboratory examination: Secondary | ICD-10-CM | POA: Diagnosis not present

## 2019-06-24 LAB — SARS CORONAVIRUS 2 (TAT 6-24 HRS): SARS Coronavirus 2: NEGATIVE

## 2019-06-25 ENCOUNTER — Other Ambulatory Visit: Payer: Self-pay

## 2019-06-25 ENCOUNTER — Ambulatory Visit (HOSPITAL_COMMUNITY)
Admission: RE | Admit: 2019-06-25 | Discharge: 2019-06-25 | Disposition: A | Discharge status: Home / Self Care | Payer: BC Managed Care – PPO | Source: Ambulatory Visit | Attending: Family Medicine | Admitting: Family Medicine

## 2019-06-25 DIAGNOSIS — D860 Sarcoidosis of lung: Secondary | ICD-10-CM | POA: Insufficient documentation

## 2019-06-25 LAB — PULMONARY FUNCTION TEST
DL/VA % pred: 94 %
DL/VA: 4.02 ml/min/mmHg/L
DLCO unc % pred: 78 %
DLCO unc: 15.89 ml/min/mmHg
FEF 25-75 Post: 3.15 L/sec
FEF 25-75 Pre: 3.18 L/sec
FEF2575-%Change-Post: 0 %
FEF2575-%Pred-Post: 143 %
FEF2575-%Pred-Pre: 145 %
FEV1-%Change-Post: 0 %
FEV1-%Pred-Post: 109 %
FEV1-%Pred-Pre: 109 %
FEV1-Post: 2.35 L
FEV1-Pre: 2.34 L
FEV1FVC-%Change-Post: 3 %
FEV1FVC-%Pred-Pre: 105 %
FEV6-%Change-Post: -3 %
FEV6-%Pred-Post: 101 %
FEV6-%Pred-Pre: 105 %
FEV6-Post: 2.66 L
FEV6-Pre: 2.76 L
FEV6FVC-%Change-Post: 0 %
FEV6FVC-%Pred-Post: 103 %
FEV6FVC-%Pred-Pre: 103 %
FVC-%Change-Post: -3 %
FVC-%Pred-Post: 98 %
FVC-%Pred-Pre: 101 %
FVC-Post: 2.66 L
FVC-Pre: 2.76 L
Post FEV1/FVC ratio: 88 %
Post FEV6/FVC ratio: 100 %
Pre FEV1/FVC ratio: 85 %
Pre FEV6/FVC Ratio: 100 %
RV % pred: 85 %
RV: 1.61 L
TLC % pred: 88 %
TLC: 4.41 L

## 2019-06-25 MED ORDER — ALBUTEROL SULFATE (2.5 MG/3ML) 0.083% IN NEBU
2.5000 mg | INHALATION_SOLUTION | Freq: Once | RESPIRATORY_TRACT | Status: AC
  Administered 2019-06-25: 2.5 mg via RESPIRATORY_TRACT

## 2019-06-30 DIAGNOSIS — R5383 Other fatigue: Secondary | ICD-10-CM | POA: Diagnosis not present

## 2019-07-29 DIAGNOSIS — Z23 Encounter for immunization: Secondary | ICD-10-CM | POA: Diagnosis not present

## 2019-08-12 ENCOUNTER — Institutional Professional Consult (permissible substitution): Payer: BC Managed Care – PPO | Admitting: Emergency Medicine

## 2019-08-22 DIAGNOSIS — Z23 Encounter for immunization: Secondary | ICD-10-CM | POA: Diagnosis not present

## 2019-09-28 DIAGNOSIS — R7301 Impaired fasting glucose: Secondary | ICD-10-CM | POA: Diagnosis not present

## 2019-09-28 DIAGNOSIS — Z13220 Encounter for screening for lipoid disorders: Secondary | ICD-10-CM | POA: Diagnosis not present

## 2019-09-28 DIAGNOSIS — Z0000 Encounter for general adult medical examination without abnormal findings: Secondary | ICD-10-CM | POA: Diagnosis not present

## 2020-01-11 DIAGNOSIS — Z1159 Encounter for screening for other viral diseases: Secondary | ICD-10-CM | POA: Diagnosis not present

## 2020-01-28 DIAGNOSIS — Z01812 Encounter for preprocedural laboratory examination: Secondary | ICD-10-CM | POA: Diagnosis not present

## 2020-02-02 DIAGNOSIS — Z1211 Encounter for screening for malignant neoplasm of colon: Secondary | ICD-10-CM | POA: Diagnosis not present

## 2020-02-02 DIAGNOSIS — K6389 Other specified diseases of intestine: Secondary | ICD-10-CM | POA: Diagnosis not present

## 2020-02-02 DIAGNOSIS — Z8371 Family history of colonic polyps: Secondary | ICD-10-CM | POA: Diagnosis not present

## 2020-02-15 ENCOUNTER — Telehealth: Payer: Self-pay | Admitting: Cardiology

## 2020-02-15 NOTE — Telephone Encounter (Signed)
Spoke with pt regarding her covid s/s and high blood pressures.  She reports s/s started last Monday and tested positive on Wednesday.  She reports her symptoms have been "mild" with some fatigue, alittle running nose and cough for a day or so but has cleared up.  Her concern is h/a and her BP being higher in the AM than she is accustomed to.  It gradually returns to normal through the day.  Advise with recent viral illness her h/a could very well be residual from that. Advised PCP can treat HTN if necessary and usually do not refer to cardiology unless it is uncontrolled.  She reports she will contact her PCP regarding this.  She will continue to monitor her BP and let us know if she believes she still needs to be seen after contacting PCP.

## 2020-02-15 NOTE — Telephone Encounter (Signed)
Pt c/o BP issue: STAT if pt c/o blurred vision, one-sided weakness or slurred speech  1. What are your last 5 BP readings?  154/83  145/83 120/70  2. Are you having any other symptoms (ex. Dizziness, headache, blurred vision, passed out)? Jaw feels different and head is throbbing as well as pain in wrist and ankle in veins   3. What is your BP issue? Chip Boer states she starting having symptoms of Covid last Monday and her BP is now fluctuating since. She states the hypertension started occurring this past weekend. An appointment has been scheduled in regards to this for 03/08/20. Please advise.

## 2020-03-07 DIAGNOSIS — H16041 Marginal corneal ulcer, right eye: Secondary | ICD-10-CM | POA: Diagnosis not present

## 2020-03-08 ENCOUNTER — Ambulatory Visit: Payer: Self-pay | Admitting: Cardiology

## 2020-03-30 DIAGNOSIS — R7301 Impaired fasting glucose: Secondary | ICD-10-CM | POA: Diagnosis not present

## 2020-03-30 DIAGNOSIS — Z13220 Encounter for screening for lipoid disorders: Secondary | ICD-10-CM | POA: Diagnosis not present

## 2020-10-20 ENCOUNTER — Other Ambulatory Visit: Payer: Self-pay | Admitting: Family Medicine

## 2020-10-20 DIAGNOSIS — Z1231 Encounter for screening mammogram for malignant neoplasm of breast: Secondary | ICD-10-CM

## 2020-10-26 ENCOUNTER — Other Ambulatory Visit: Payer: Self-pay | Admitting: Family Medicine

## 2020-10-26 DIAGNOSIS — Z1231 Encounter for screening mammogram for malignant neoplasm of breast: Secondary | ICD-10-CM

## 2020-11-28 ENCOUNTER — Other Ambulatory Visit: Payer: Self-pay

## 2020-11-28 ENCOUNTER — Ambulatory Visit
Admission: RE | Admit: 2020-11-28 | Discharge: 2020-11-28 | Disposition: A | Discharge status: Home / Self Care | Payer: 59 | Source: Ambulatory Visit | Attending: Family Medicine | Admitting: Family Medicine

## 2020-11-28 DIAGNOSIS — Z1231 Encounter for screening mammogram for malignant neoplasm of breast: Secondary | ICD-10-CM

## 2020-12-20 ENCOUNTER — Other Ambulatory Visit: Payer: Self-pay | Admitting: Internal Medicine

## 2020-12-20 DIAGNOSIS — R202 Paresthesia of skin: Secondary | ICD-10-CM

## 2020-12-21 ENCOUNTER — Other Ambulatory Visit: Payer: Self-pay | Admitting: Internal Medicine

## 2020-12-21 ENCOUNTER — Encounter: Payer: Self-pay | Admitting: Neurology

## 2021-01-05 ENCOUNTER — Ambulatory Visit
Admission: RE | Admit: 2021-01-05 | Discharge: 2021-01-05 | Disposition: A | Discharge status: Home / Self Care | Payer: 59 | Source: Ambulatory Visit | Attending: Internal Medicine | Admitting: Internal Medicine

## 2021-01-05 ENCOUNTER — Other Ambulatory Visit: Payer: Self-pay

## 2021-01-05 DIAGNOSIS — R202 Paresthesia of skin: Secondary | ICD-10-CM

## 2021-01-05 MED ORDER — GADOBENATE DIMEGLUMINE 529 MG/ML IV SOLN
15.0000 mL | Freq: Once | INTRAVENOUS | Status: AC | PRN
  Administered 2021-01-05: 17:00:00 10 mL via INTRAVENOUS

## 2021-03-02 ENCOUNTER — Encounter: Payer: Self-pay | Admitting: Neurology

## 2021-03-02 ENCOUNTER — Other Ambulatory Visit (INDEPENDENT_AMBULATORY_CARE_PROVIDER_SITE_OTHER): Payer: 59

## 2021-03-02 ENCOUNTER — Ambulatory Visit (INDEPENDENT_AMBULATORY_CARE_PROVIDER_SITE_OTHER): Payer: 59 | Admitting: Neurology

## 2021-03-02 ENCOUNTER — Other Ambulatory Visit: Payer: Self-pay

## 2021-03-02 VITALS — BP 133/85 | HR 78 | Ht 64.0 in | Wt 174.0 lb

## 2021-03-02 DIAGNOSIS — R202 Paresthesia of skin: Secondary | ICD-10-CM

## 2021-03-02 DIAGNOSIS — G44209 Tension-type headache, unspecified, not intractable: Secondary | ICD-10-CM | POA: Diagnosis not present

## 2021-03-02 LAB — TSH: TSH: 2.29 u[IU]/mL (ref 0.35–5.50)

## 2021-03-02 LAB — VITAMIN B12: Vitamin B-12: 426 pg/mL (ref 211–911)

## 2021-03-02 LAB — FOLATE: Folate: >24.2 ng/mL (ref >5.9)

## 2021-03-02 NOTE — Patient Instructions (Addendum)
Check labs  If your symptoms progress and you would like to proceed with nerve testing please call the office

## 2021-03-02 NOTE — Progress Notes (Signed)
[facility] Clinic Note - Initial Visit   Date: 03/02/21  Aimee Holmes MRN: [id number] DOB: [birth year]   Dear Dr. Clelia Croft:  Thank you for your kind referral of QUINCEY NORED for consultation of left arm and bilateral leg numbness. Although her history is well known to you, please allow [place] to reiterate it for the purpose of our medical record. The patient was accompanied to the clinic by self.   History of Present Illness: Aimee Holmes is a 56 y.o. left-handed female with anxiety, hyperlipidemia, sarcoidosis, and tobacco use presenting for evaluation of left arm and bilateral leg numbness.   Starting on November 25th, she developed constant numbness/tingling involving the hands and feet, worse on the left forearm. Sometimes, it may also involve the left forearm.  There is no identifiable triggers or alleviating factors.  No weakness, imbalance, or falls.   She also complains of slight pressure over the forehead and "fog".  She is still able to continue with her usual activities.   Prior testing shows normal MRI brain (incidental 19mm pineal cyst).  She smokes about 6 cigarettes/daily. Does not drink alcohol.  She works as Social worker.  She lives at home with husband and son.    Out-side paper records, electronic medical record, and images have been reviewed where available and summarized as:  Labs 09/2020:  HbA1c 6.1  MRI brain wwo contrast 01/05/2021: No evidence of acute intracranial abnormality. 5 mm pineal cyst. Otherwise unremarkable MRI appearance of the brain.   Past Medical History:  Diagnosis Date   Allergic rhinitis    Anxiety    Hyperlipidemia    Lymphadenopathy    Obesity    Sarcoidosis     Past Surgical History:  Procedure Laterality Date   BLADDER SURGERY     tack     Medications:  Outpatient Encounter Medications as of 03/02/2021  Medication Sig   Multiple Vitamin (MULTIVITAMIN WITH MINERALS) TABS tablet Take 1 tablet  by mouth daily.   naproxen sodium (ALEVE) 220 MG tablet Take 220 mg by mouth daily as needed (pain).   No facility-administered encounter medications on file as of 03/02/2021.    Allergies:  Allergies  Allergen Reactions   Bee Venom     Causes swelling in area of sting   Latex     Breaks out on her fingers     Family History: Family History  Problem Relation Age of Onset   Breast cancer Paternal Aunt    Throat cancer Paternal Grandfather        non smoker   Cancer Paternal Grandmother     Social History: Social History   Tobacco Use   Smoking status: Every Day    Packs/day: 0.50    Types: Cigarettes   Smokeless tobacco: Never  Substance Use Topics   Alcohol use: No   Drug use: No   Social History   Social History Narrative   Left Handed    Lives in a two story home    Vital Signs:  BP 133/85    Pulse 78    Ht 5\' 4"  (1.626 m)    Wt 174 lb (78.9 kg)    SpO2 97%    BMI 29.87 kg/m     Neurological Exam: MENTAL STATUS including orientation to time, place, person, recent and remote memory, attention span and concentration, language, and fund of knowledge is normal.  Speech is not dysarthric.  CRANIAL NERVES: II:  No visual field defects.  III-IV-VI:  Pupils equal round and reactive to light.  Normal conjugate, extra-ocular eye movements in all directions of gaze.  No nystagmus.  No ptosis.   V:  Normal facial sensation.    VII:  Normal facial symmetry and movements.   VIII:  Normal hearing and vestibular function.   IX-X:  Normal palatal movement.   XI:  Normal shoulder shrug and head rotation.   XII:  Normal tongue strength and range of motion, no deviation or fasciculation.  MOTOR:  No atrophy, fasciculations or abnormal movements.  No pronator drift.   Upper Extremity:  Right  Left  Deltoid  5/5   5/5   Biceps  5/5   5/5   Triceps  5/5   5/5   Infraspinatus 5/5  5/5  Medial pectoralis 5/5  5/5  Wrist extensors  5/5   5/5   Wrist flexors  5/5   5/5    Finger extensors  5/5   5/5   Finger flexors  5/5   5/5   Dorsal interossei  5/5   5/5   Abductor pollicis  5/5   5/5   Tone (Ashworth scale)  0  0   Lower Extremity:  Right  Left  Hip flexors  5/5   5/5   Hip extensors  5/5   5/5   Adductor 5/5  5/5  Abductor 5/5  5/5  Knee flexors  5/5   5/5   Knee extensors  5/5   5/5   Dorsiflexors  5/5   5/5   Plantarflexors  5/5   5/5   Toe extensors  5/5   5/5   Toe flexors  5/5   5/5   Tone (Ashworth scale)  0  0   MSRs:  Right        Left                  brachioradialis 2+  2+  biceps 2+  2+  triceps 2+  2+  patellar 2+  2+  ankle jerk 2+  2+  Hoffman no  no  plantar response down  down   SENSORY:  Normal and symmetric perception of light touch, pinprick, vibration, and proprioception.  Romberg's sign absent.   COORDINATION/GAIT: Normal finger-to- nose-finger and heel-to-shin.  Intact rapid alternating movements bilaterally.  Gait narrow based and stable. Tandem and stressed gait intact.    IMPRESSION: Paresthesias of the hands and feet  - NCS/EMG of the left arm and leg declined  - Check TSH, vitamin B12, folate  2.  Tension headache  - OK to treat with ibuprofen or tylenol as needed  - MRI brain personally viewed and does not show any structural abnormalities to cause her symptoms  3.  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  Pt reassured that this is not causing any of her symptoms.    Thank you for allowing me to participate in patient's care.  If I can answer any additional questions, I would be pleased to do so.    Sincerely,    Jule Whitsel K. Allena Katz, DO

## 2021-09-13 ENCOUNTER — Other Ambulatory Visit: Payer: Self-pay | Admitting: Family Medicine

## 2021-09-18 ENCOUNTER — Other Ambulatory Visit: Payer: Self-pay | Admitting: Family Medicine

## 2021-09-18 DIAGNOSIS — Z1231 Encounter for screening mammogram for malignant neoplasm of breast: Secondary | ICD-10-CM

## 2021-10-26 ENCOUNTER — Other Ambulatory Visit (HOSPITAL_COMMUNITY): Payer: Self-pay | Admitting: Family Medicine

## 2021-10-26 DIAGNOSIS — D860 Sarcoidosis of lung: Secondary | ICD-10-CM

## 2021-11-05 ENCOUNTER — Other Ambulatory Visit (HOSPITAL_COMMUNITY): Payer: Self-pay | Admitting: Radiology

## 2021-11-05 DIAGNOSIS — D860 Sarcoidosis of lung: Secondary | ICD-10-CM

## 2021-11-20 ENCOUNTER — Encounter (HOSPITAL_COMMUNITY): Payer: 59

## 2021-11-21 ENCOUNTER — Other Ambulatory Visit (HOSPITAL_COMMUNITY): Payer: Self-pay

## 2021-11-29 ENCOUNTER — Ambulatory Visit
Admission: RE | Admit: 2021-11-29 | Discharge: 2021-11-29 | Disposition: A | Discharge status: Home / Self Care | Payer: 59 | Source: Ambulatory Visit

## 2021-11-29 DIAGNOSIS — Z1231 Encounter for screening mammogram for malignant neoplasm of breast: Secondary | ICD-10-CM | POA: Diagnosis not present

## 2022-01-29 ENCOUNTER — Inpatient Hospital Stay (HOSPITAL_COMMUNITY): Admission: RE | Admit: 2022-01-29 | Payer: 59 | Source: Ambulatory Visit

## 2022-02-01 ENCOUNTER — Ambulatory Visit (HOSPITAL_COMMUNITY)
Admission: RE | Admit: 2022-02-01 | Discharge: 2022-02-01 | Disposition: A | Discharge status: Home / Self Care | Payer: 59 | Source: Ambulatory Visit | Attending: Family Medicine | Admitting: Family Medicine

## 2022-02-01 DIAGNOSIS — D860 Sarcoidosis of lung: Secondary | ICD-10-CM | POA: Insufficient documentation

## 2022-02-01 LAB — PULMONARY FUNCTION TEST
DL/VA % pred: 91 %
DL/VA: 3.89 ml/min/mmHg/L
DLCO unc % pred: 75 %
DLCO unc: 15.2 ml/min/mmHg
FEF 25-75 Pre: 3.06 L/sec
FEF2575-%Pred-Pre: 127 %
FEV1-%Pred-Pre: 92 %
FEV1-Pre: 2.36 L
FEV1FVC-%Pred-Pre: 107 %
FEV6-%Pred-Pre: 87 %
FEV6-Pre: 2.79 L
FEV6FVC-%Pred-Pre: 103 %
FVC-%Pred-Pre: 84 %
FVC-Pre: 2.79 L
Pre FEV1/FVC ratio: 85 %
Pre FEV6/FVC Ratio: 100 %
RV % pred: 77 %
RV: 1.49 L
TLC % pred: 85 %
TLC: 4.26 L

## 2022-02-05 ENCOUNTER — Ambulatory Visit (HOSPITAL_COMMUNITY): Payer: 59 | Attending: Family Medicine

## 2022-02-05 DIAGNOSIS — D860 Sarcoidosis of lung: Secondary | ICD-10-CM | POA: Insufficient documentation

## 2022-02-05 LAB — ECHOCARDIOGRAM COMPLETE
Area-P 1/2: 3.48 cm2
S' Lateral: 2.6 cm

## 2022-04-03 ENCOUNTER — Telehealth: Payer: Self-pay

## 2022-04-03 NOTE — Telephone Encounter (Signed)
Spoke with patient to screen her smoking history for LDCT.  Patient did not begin smoking until age 57 years.  Has only averaged 3-4 cigarettes a day, and quit for several years when pregnant.  She would not meet the 20 pack years requirement since she is a light smoker.  Her pack years would be under 15 years.  Advised to discuss with her PCP for possible alternative.  She is interested in having a CT just to 'ease her mind' if that would be recommended for her.  Patient to check in with office if she has not heard from them regarding other options for CT chest.  Referral closed. Will call office to notify them of ineligible status.

## 2022-10-01 DIAGNOSIS — Z1152 Encounter for screening for COVID-19: Secondary | ICD-10-CM | POA: Diagnosis not present

## 2022-10-01 DIAGNOSIS — Z20822 Contact with and (suspected) exposure to covid-19: Secondary | ICD-10-CM | POA: Diagnosis not present

## 2022-11-07 ENCOUNTER — Other Ambulatory Visit: Payer: Self-pay | Admitting: Family Medicine

## 2022-11-07 DIAGNOSIS — Z1231 Encounter for screening mammogram for malignant neoplasm of breast: Secondary | ICD-10-CM

## 2022-12-05 ENCOUNTER — Ambulatory Visit: Payer: 59

## 2023-01-01 ENCOUNTER — Ambulatory Visit
Admission: RE | Admit: 2023-01-01 | Discharge: 2023-01-01 | Disposition: A | Discharge status: Home / Self Care | Payer: 59 | Source: Ambulatory Visit | Attending: Family Medicine | Admitting: Family Medicine

## 2023-01-01 DIAGNOSIS — Z1231 Encounter for screening mammogram for malignant neoplasm of breast: Secondary | ICD-10-CM

## 2023-06-01 IMAGING — MR MR HEAD WO/W CM
12 series · 48 of 48 positions shown · IV contrast (15ml Multihance)
Comparison: Head CT 09/02/2017.

CLINICAL DATA: Provided history: Arm paresthesia, left. Additional
history provided by scanning technologist: Patient reports
tingling/numbness in bilateral extremities for weeks.

EXAM:
MRI HEAD WITHOUT AND WITH CONTRAST
TECHNIQUE: Multiplanar, multiecho pulse sequences of the brain and surrounding
structures were obtained without and with intravenous contrast.
CONTRAST:  10mL MULTIHANCE GADOBENATE DIMEGLUMINE 529 MG/ML IV SOLN

[Series 2: T1 · sagittal · 5.0mm · 0.49mm/px · 1 of 25 slices shown]
[im 1/25]
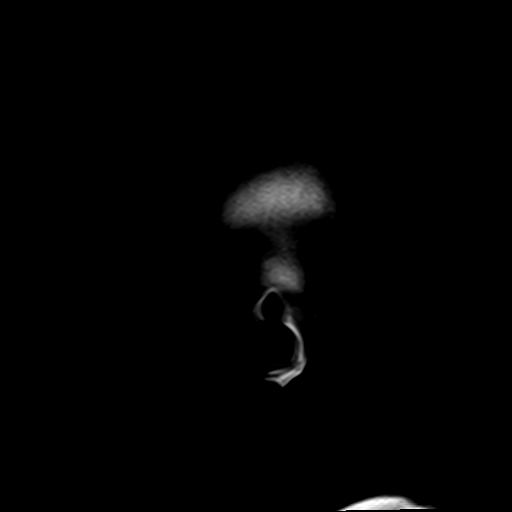

[Series 3: DWI · axial · 3.0mm · 1.88mm/px · z∈[-71,+82]mm · 7 of 104 slices shown (1 of 4)]
[im 1/104]
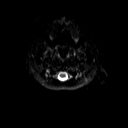
[im 18/104]
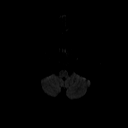
[im 35/104]
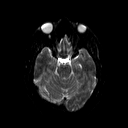
[im 52/104]
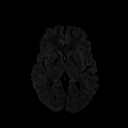
[im 69/104]
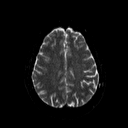
[im 86/104]
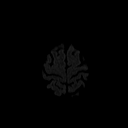
[im 104/104]
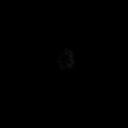

[Series 4: DWI · axial · 3.0mm · 1.88mm/px · z∈[-71,+82]mm · 3 of 51 slices shown (2 of 4)]
[im 1/51]
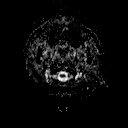
[im 26/51]
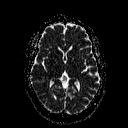
[im 51/51]
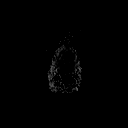

[Series 5: DWI · coronal · 5.0mm · 1.80mm/px · 4 of 70 slices shown (3 of 4)]
[im 1/70]
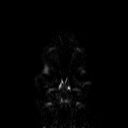
[im 24/70]
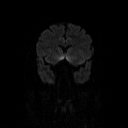
[im 47/70]
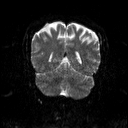
[im 70/70]
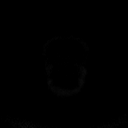

[Series 6: DWI · coronal · 5.0mm · 1.80mm/px · 2 of 37 slices shown (4 of 4)]
[im 1/37]
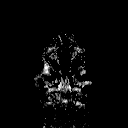
[im 37/37]
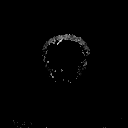

[Series 7: T2 · axial · 5.0mm · 0.78mm/px · z∈[-72,+83]mm · 2 of 24 slices shown (1 of 2)]
[im 1/24]
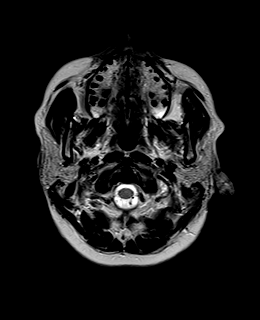
[im 24/24]
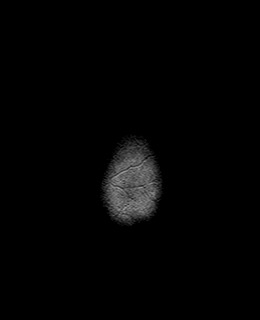

[Series 8: FLAIR · axial · 3.0mm · 0.47mm/px · z∈[-69,+84]mm · 2 of 34 slices shown]
[im 1/34]
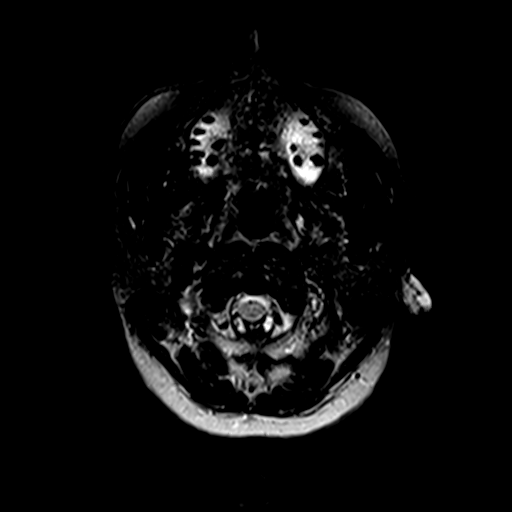
[im 34/34]
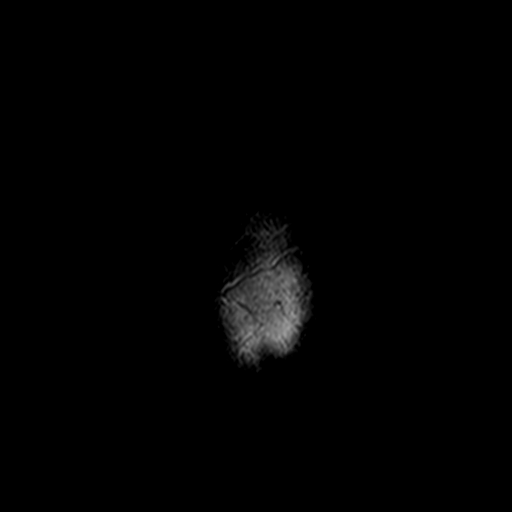

[Series 10: swi_images · axial · 4.0mm · 0.94mm/px · z∈[-71,+85]mm · 3 of 40 slices shown]
[im 1/40]
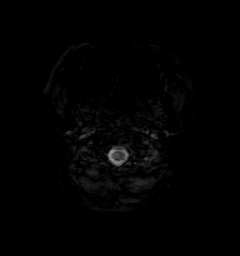
[im 20/40]
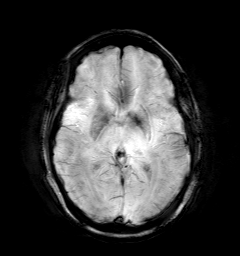
[im 40/40]
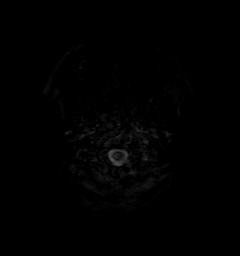

[Series 11: t1_mpr_tra · axial · 1.0mm · 0.75mm/px · z∈[-72,+87]mm · 10 of 160 slices shown]
[im 1/160]
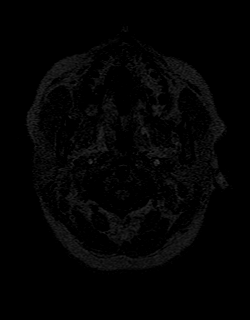
[im 18/160]
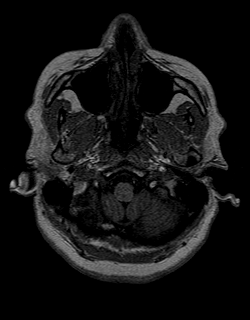
[im 36/160]
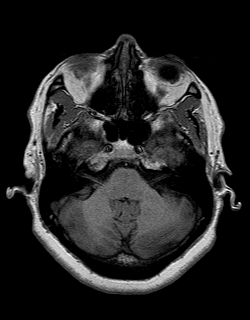
[im 54/160]
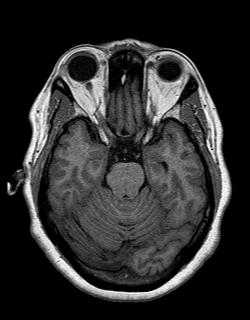
[im 71/160]
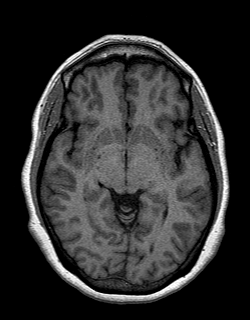
[im 89/160]
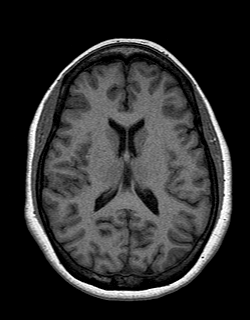
[im 107/160]
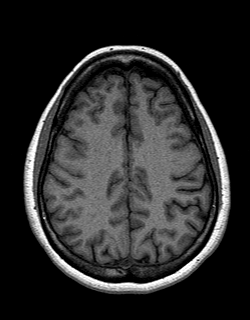
[im 124/160]
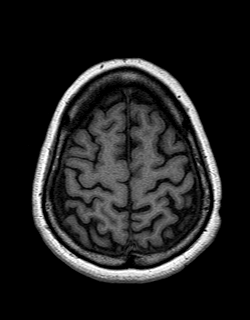
[im 142/160]
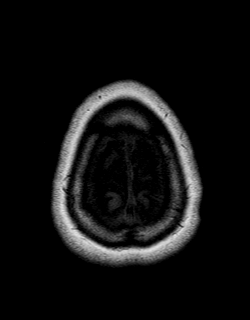
[im 160/160]
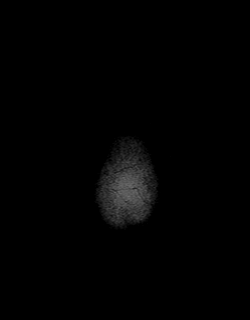

[Series 12: T2 · coronal · 5.0mm · 0.45mm/px · 2 of 29 slices shown (2 of 2)]
[im 1/29]
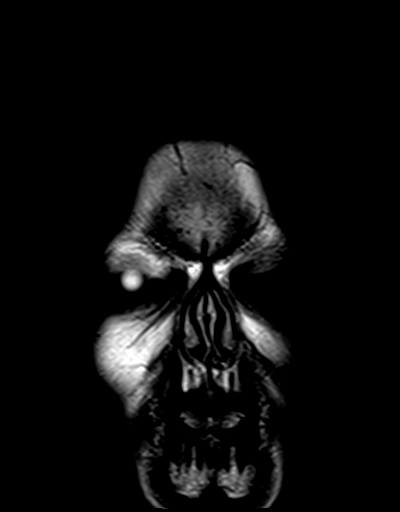
[im 29/29]
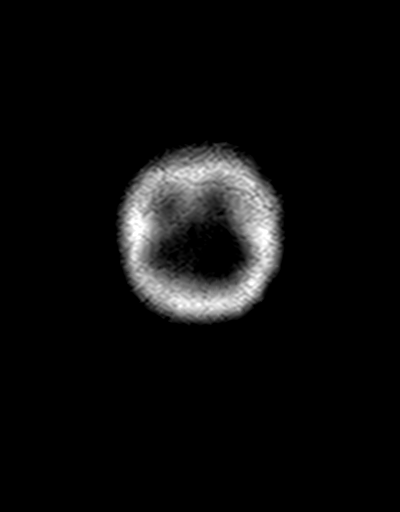

[Series 13: t1_mpr_tra post · axial · 1.0mm · 0.75mm/px · z∈[-72,+87]mm · 10 of 160 slices shown]
[im 1/160]
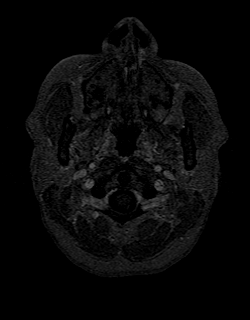
[im 18/160]
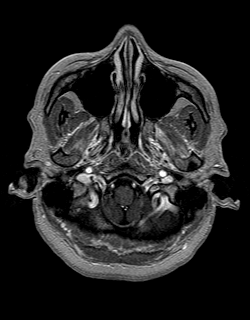
[im 36/160]
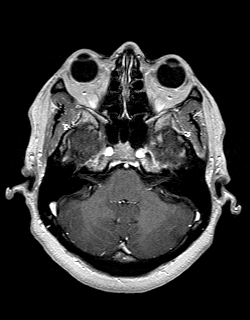
[im 54/160]
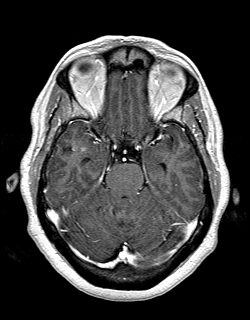
[im 71/160]
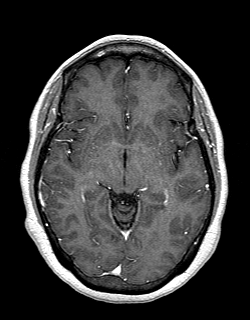
[im 89/160]
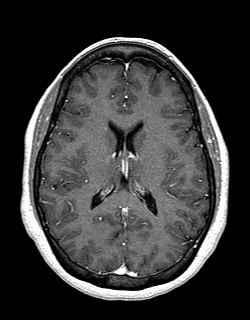
[im 107/160]
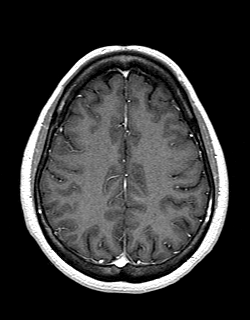
[im 124/160]
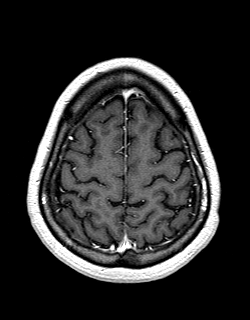
[im 142/160]
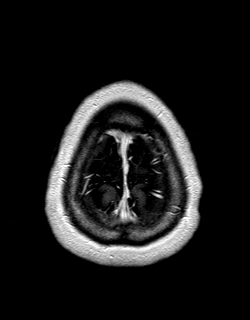
[im 160/160]
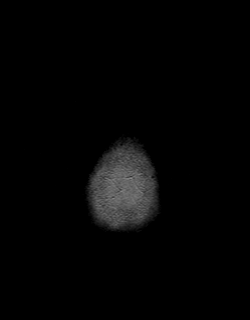

[Series 14: post cor · coronal · 5.0mm · 0.45mm/px · 2 of 29 slices shown]
[im 1/29]
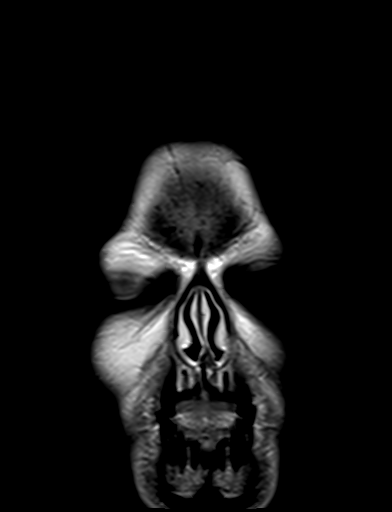
[im 29/29]
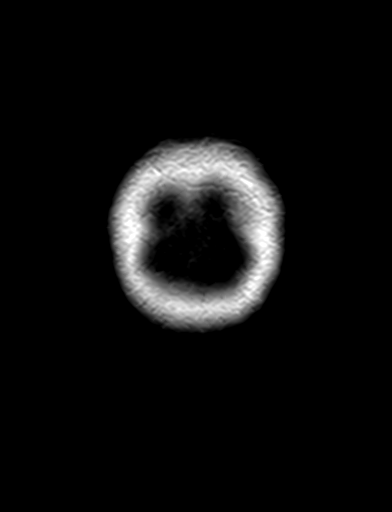

[48 of 48 positions shown; findings below may reference images not displayed]

FINDINGS: Brain:

Cerebral volume appears normal for age.

5 mm pineal cyst.

No cortical encephalomalacia is identified. No significant cerebral
white matter disease.

There is no acute infarct.

No evidence of an intracranial mass.

No chronic intracranial blood products.

No extra-axial fluid collection.

No midline shift.

No pathologic intracranial enhancement identified.

Vascular: Maintained flow voids within the proximal large arterial
vessels. Small developmental venous anomaly within the medial left
parietal lobe (anatomic variant).

Skull and upper cervical spine: No focal suspicious marrow lesion.

Sinuses/Orbits: Visualized orbits show no acute finding. Trace
mucosal thickening within the bilateral ethmoid sinuses.
IMPRESSION: No evidence of acute intracranial abnormality.

5 mm pineal cyst.

Otherwise unremarkable MRI appearance of the brain.

## 2023-10-27 DIAGNOSIS — Z124 Encounter for screening for malignant neoplasm of cervix: Secondary | ICD-10-CM | POA: Diagnosis not present

## 2024-02-06 ENCOUNTER — Other Ambulatory Visit: Payer: Self-pay | Admitting: Family Medicine

## 2024-02-06 DIAGNOSIS — Z1231 Encounter for screening mammogram for malignant neoplasm of breast: Secondary | ICD-10-CM

## 2024-03-08 ENCOUNTER — Ambulatory Visit
